# Patient Record
Sex: Female | Born: 1995 | Hispanic: Yes | Marital: Single | Smoking: Never smoker
Health system: Southern US, Community
[De-identification: ages and names within clinical notes are randomized; demographics above are authoritative.]

## PROBLEM LIST (undated history)

## (undated) DIAGNOSIS — K2970 Gastritis, unspecified, without bleeding: Secondary | ICD-10-CM

## (undated) HISTORY — PX: NO PAST SURGERIES: SHX2092

## (undated) HISTORY — DX: Gastritis, unspecified, without bleeding: K29.70

---

## 2014-11-10 NOTE — L&D Delivery Note (Signed)
Patient is 19 y.o. G1P0000 2267w2d admitted in active labor and progressed without augmentation, [facility]   Delivery Note At 12:03 PM a viable female was delivered via Vaginal, Spontaneous Delivery (Presentation:Vertex, Occiput Anterior).  APGAR: 9, 9; weight -pending .   Placenta status: Intact, Spontaneous.  Cord: 3 vessels with the following complications: Second degree perineal laceration repaired in the typical fashion.   Cord pH: not collected  Anesthesia: Epidural  Episiotomy: None Lacerations: 2nd degree Suture Repair: 3.0 Est. Blood Loss (mL): 450  Mom to postpartum.  Baby to Couplet care / Skin to Skin.  Isa RankinKimberly Niles [facility] 09/24/2015, 12:33 PM

## 2015-08-14 LAB — OB RESULTS CONSOLE GC/CHLAMYDIA
CHLAMYDIA, DNA PROBE: NEGATIVE
GC PROBE AMP, GENITAL: NEGATIVE

## 2015-08-14 LAB — OB RESULTS CONSOLE ANTIBODY SCREEN: Antibody Screen: NEGATIVE

## 2015-08-14 LAB — OB RESULTS CONSOLE RUBELLA ANTIBODY, IGM: RUBELLA: IMMUNE

## 2015-08-14 LAB — OB RESULTS CONSOLE HIV ANTIBODY (ROUTINE TESTING): HIV: NONREACTIVE

## 2015-08-14 LAB — OB RESULTS CONSOLE PLATELET COUNT: PLATELETS: 189 10*3/uL

## 2015-08-14 LAB — OB RESULTS CONSOLE HGB/HCT, BLOOD
HEMATOCRIT: 37 %
Hemoglobin: 12 g/dL

## 2015-08-14 LAB — OB RESULTS CONSOLE HEPATITIS B SURFACE ANTIGEN: Hepatitis B Surface Ag: NEGATIVE

## 2015-08-14 LAB — OB RESULTS CONSOLE RPR: RPR: NONREACTIVE

## 2015-08-14 LAB — OB RESULTS CONSOLE ABO/RH: RH TYPE: POSITIVE

## 2015-08-22 LAB — OB RESULTS CONSOLE GBS: GBS: NEGATIVE

## 2015-08-29 ENCOUNTER — Encounter: Payer: Self-pay | Admitting: Advanced Practice Midwife

## 2015-09-03 ENCOUNTER — Encounter: Payer: Self-pay | Admitting: *Deleted

## 2015-09-03 DIAGNOSIS — O0933 Supervision of pregnancy with insufficient antenatal care, third trimester: Secondary | ICD-10-CM | POA: Insufficient documentation

## 2015-09-04 ENCOUNTER — Encounter: Payer: Self-pay | Admitting: Family

## 2015-09-04 ENCOUNTER — Ambulatory Visit (INDEPENDENT_AMBULATORY_CARE_PROVIDER_SITE_OTHER): Payer: Medicaid Other | Admitting: Family

## 2015-09-04 VITALS — BP 92/56 | HR 76 | Ht <= 58 in | Wt 104.7 lb

## 2015-09-04 DIAGNOSIS — O0933 Supervision of pregnancy with insufficient antenatal care, third trimester: Secondary | ICD-10-CM | POA: Diagnosis not present

## 2015-09-04 DIAGNOSIS — Z23 Encounter for immunization: Secondary | ICD-10-CM

## 2015-09-04 LAB — POCT URINALYSIS DIP (DEVICE)
BILIRUBIN URINE: NEGATIVE
Glucose, UA: NEGATIVE mg/dL
HGB URINE DIPSTICK: NEGATIVE
Nitrite: NEGATIVE
PH: 6.5 (ref 5.0–8.0)
Protein, ur: NEGATIVE mg/dL
SPECIFIC GRAVITY, URINE: 1.025 (ref 1.005–1.030)
Urobilinogen, UA: 0.2 mg/dL (ref 0.0–1.0)

## 2015-09-04 MED ORDER — TETANUS-DIPHTH-ACELL PERTUSSIS 5-2.5-18.5 LF-MCG/0.5 IM SUSP
0.5000 mL | Freq: Once | INTRAMUSCULAR | Status: AC
  Administered 2015-09-04: 0.5 mL via INTRAMUSCULAR

## 2015-09-04 NOTE — Patient Instructions (Signed)
AREA PEDIATRIC/FAMILY PRACTICE PHYSICIANS  [facility] [address] [place], [place] [address] Phone - [phone]   Fax - [phone]  [facility] AM[facility] [address] [place], [place]  [address] Phone - [phone]   Fax - [phone]  [facility] [address] [place], [place]  [address] Phone - [phone]   Fax - [phone]  [facility] [address] [place], [place]  [address] Phone - [phone]   Fax - [phone]  [facility] [address] [place], [place]  [address] Phone - [phone]   Fax - [phone]  [facility] [address] [place], [place]  [address] Phone - [phone]   Fax - [phone]  [facility] [address] [place], [place]  [address] Phone - [phone]   Fax - [phone]  [facility] [address], [address] [place], [place]  [address] Phone - [phone]   Fax - [phone]  [facility] [address] [place], [place]  [address] Phone - [phone]   Fax - [phone] [facility] [address] [place], [place]  [address] Phone - [phone]   Fax - [phone]  [facility] [address] [place], [place]  [address] Phone - [phone]   Fax - [phone]  [facility] [address] [place], [place]  [address] Phone - [phone]   Fax - [phone]  [facility] [address], [address] [place], [place]  [address] Phone - [phone]   Fax - [phone]  SHILPA GOSRANI [address] [place], [place]  [address] Phone - [phone]  [facility] [address] [place], [place]  [address] Phone - [phone]   Fax - [phone]  [facility] [address] [place], [place]  [address] Phone - [phone]   Fax - [phone]  [facility] [address] [place], [place]  [address] Phone - [phone]   Fax - [phone]  [facility] [address] [place], [place]  [address] Phone - [phone]   Fax - [phone]   [facility] [address] [place], [place]  [address] Phone - [phone]   Fax - [phone]  [facility] [address] [place], [place]  [address] Phone - [phone]   Fax - [phone]  DAVID RUBIN [address] [place], [place]  [address] Phone - [phone]   Fax - [phone]  [facility] [address] [place], [place]  [address] Phone - [phone]   Fax - [phone]  [facility] [address] [place], [place]  [address] Phone - [phone]   Fax - [phone] [facility] [address] [place], [place]  [address] Phone - [phone]   Fax - [phone]  [facility] [address] [place], [place]  [address] Phone - [phone]   Fax - [phone]  [facility] [address] ,   [address] Phone - [phone]   Fax - [phone]   

## 2015-09-04 NOTE — Progress Notes (Signed)
Raquel used for interpreter; new OB + 28wk packets given

## 2015-09-04 NOTE — Progress Notes (Signed)
   Subjective:    Brianna Bishop is a G1P0000 6272w3d being seen today for her first obstetrical visit.  Her obstetrical history is significant for late prenatal care. [place].  Recently moved here with sister.  Received two late pregnancy OB visit.  Patient does intend to breast feed. Pregnancy history fully reviewed.  Patient reports no complaints.  Filed Vitals:   09/04/15 0842 09/04/15 0844  BP: 92/56   Pulse: 76   Height:  4\' 10"  (1.473 m)  Weight: 104 lb 11.2 oz (47.492 kg)     HISTORY: OB History  Gravida Para Term Preterm AB SAB TAB Ectopic Multiple Living  1 0 0 0 0 0 0 0 0 0     # Outcome Date GA Lbr Len/2nd Weight Sex Delivery Anes PTL Lv  1 Current              Past Medical History  Diagnosis Date  . Gastritis    Past Surgical History  Procedure Laterality Date  . No past surgeries     Family History  Problem Relation Age of Onset  . Cancer Maternal Grandmother   . Diabetes Paternal Grandmother      Exam   Filed Vitals:   09/04/15 0842 09/04/15 0844  BP: 92/56   Pulse: 76   Height:  4\' 10"  (1.473 m)  Weight: 104 lb 11.2 oz (47.492 kg)     Fetal Status: Fetal Heart Rate (bpm): 131 Fundal Height: 35 cm Movement: Present     General:  Alert, oriented and cooperative. Patient is in no acute distress.  Skin: Skin is warm and dry. No rash noted.   Cardiovascular: Normal heart rate noted  Respiratory: Normal respiratory effort, no problems with respiration noted  Abdomen: Soft, gravid, appropriate for gestational age. Pain/Pressure: Present     Pelvic: Vag. Bleeding: None     Cervical exam deferred        Extremities: Normal range of motion.  Edema: None  Mental Status: Normal mood and affect. Normal behavior. Normal judgment and thought content.   Urinalysis: Urine Protein: Negative Urine Glucose: Negative     Assessment:    Pregnancy: G1P0000 Patient Active Problem List   Diagnosis Date Noted  . Late prenatal care  affecting pregnancy in third trimester 09/03/2015        Plan:     Initial labs drawn. Prenatal vitamins. Problem list reviewed and updated. Ultrasound discussed; fetal survey: ordered. Follow up in 1 weeks.  Marlis EdelsonKARIM, Matie Dimaano N 09/04/2015

## 2015-09-05 LAB — GLUCOSE TOLERANCE, 1 HOUR (50G) W/O FASTING: Glucose, 1 Hour GTT: 98 mg/dL (ref 70–140)

## 2015-09-07 ENCOUNTER — Ambulatory Visit (HOSPITAL_COMMUNITY): Payer: Self-pay

## 2015-09-10 ENCOUNTER — Other Ambulatory Visit: Payer: Self-pay | Admitting: General Practice

## 2015-09-10 ENCOUNTER — Ambulatory Visit (HOSPITAL_COMMUNITY)
Admission: RE | Admit: 2015-09-10 | Discharge: 2015-09-10 | Disposition: A | Discharge status: Home / Self Care | Payer: Medicaid Other | Source: Ambulatory Visit | Attending: Family | Admitting: Family

## 2015-09-10 DIAGNOSIS — Z36 Encounter for antenatal screening of mother: Secondary | ICD-10-CM | POA: Insufficient documentation

## 2015-09-10 DIAGNOSIS — O0933 Supervision of pregnancy with insufficient antenatal care, third trimester: Secondary | ICD-10-CM

## 2015-09-10 DIAGNOSIS — Z3A38 38 weeks gestation of pregnancy: Secondary | ICD-10-CM | POA: Diagnosis not present

## 2015-09-11 ENCOUNTER — Ambulatory Visit (INDEPENDENT_AMBULATORY_CARE_PROVIDER_SITE_OTHER): Payer: Medicaid Other | Admitting: Obstetrics and Gynecology

## 2015-09-11 VITALS — BP 93/46 | HR 68 | Temp 97.7°F | Wt 107.9 lb

## 2015-09-11 DIAGNOSIS — O0933 Supervision of pregnancy with insufficient antenatal care, third trimester: Secondary | ICD-10-CM | POA: Diagnosis not present

## 2015-09-11 DIAGNOSIS — Z113 Encounter for screening for infections with a predominantly sexual mode of transmission: Secondary | ICD-10-CM | POA: Diagnosis not present

## 2015-09-11 LAB — POCT URINALYSIS DIP (DEVICE)
BILIRUBIN URINE: NEGATIVE
Glucose, UA: NEGATIVE mg/dL
Ketones, ur: NEGATIVE mg/dL
NITRITE: NEGATIVE
PH: 7 (ref 5.0–8.0)
PROTEIN: NEGATIVE mg/dL
Specific Gravity, Urine: 1.02 (ref 1.005–1.030)
UROBILINOGEN UA: 2 mg/dL — AB (ref 0.0–1.0)

## 2015-09-11 NOTE — Progress Notes (Signed)
Alviris used for interpreter [place] le[place] on UA  Reviewed tip of week with patient

## 2015-09-11 NOTE — Progress Notes (Signed)
Subjective:  Brianna Bishop is a 19 y.o. G1P0000 at 9682w3d being seen today for ongoing prenatal care.  Patient reports no complaints.  Contractions: Not present.  Vag. Bleeding: None. Movement: Present. Denies leaking of fluid.   The following portions of the patient's history were reviewed and updated as appropriate: allergies, current medications, past family history, past medical history, past social history, past surgical history and problem list. Problem list updated.  Objective:   Filed Vitals:   09/11/15 1019  BP: 93/46  Pulse: 68  Temp: 97.7 F (36.5 C)  Weight: 107 lb 14.4 oz (48.943 kg)    Fetal Status: Fetal Heart Rate (bpm): 127   Movement: Present     General:  Alert, oriented and cooperative. Patient is in no acute distress.  Skin: Skin is warm and dry. No rash noted.   Cardiovascular: Normal heart rate noted  Respiratory: Normal respiratory effort, no problems with respiration noted  Abdomen: Soft, gravid, appropriate for gestational age. Pain/Pressure: Absent     Pelvic: Vag. Bleeding: None     Cervical exam deferred        Extremities: Normal range of motion.  Edema: None  Mental Status: Normal mood and affect. Normal behavior. Normal judgment and thought content.   Urinalysis: Urine Protein: Negative Urine Glucose: Negative  Assessment and Plan:  Pregnancy: G1P0000 at 8482w3d  1. Late prenatal care affecting pregnancy in third trimester - gc/chlamydia today, neg gbs 10/12, will need to be redrawn if doesn't delivery by 11/16 - requesting ultrasound results as pt says [place] normal anatomy [place] in [place], and currently we are dating pregnancy by a 38-week scan  Term labor symptoms and general obstetric precautions including but not limited to vaginal bleeding, contractions, leaking of fluid and fetal movement were reviewed in detail with the patient. Please refer to After Visit Summary for other counseling recommendations.  Return in about 1 week (around  09/18/2015).   Kathrynn RunningNoah Bedford Ikran Patman, MD

## 2015-09-12 LAB — GC/CHLAMYDIA PROBE AMP (~~LOC~~) NOT AT ARMC
CHLAMYDIA, DNA PROBE: NEGATIVE
NEISSERIA GONORRHEA: NEGATIVE

## 2015-09-18 ENCOUNTER — Ambulatory Visit (INDEPENDENT_AMBULATORY_CARE_PROVIDER_SITE_OTHER): Payer: Medicaid Other | Admitting: Advanced Practice Midwife

## 2015-09-18 ENCOUNTER — Other Ambulatory Visit: Payer: Medicaid Other

## 2015-09-18 VITALS — BP 96/56 | HR 68 | Temp 98.2°F | Wt 107.8 lb

## 2015-09-18 DIAGNOSIS — O0933 Supervision of pregnancy with insufficient antenatal care, third trimester: Secondary | ICD-10-CM | POA: Diagnosis not present

## 2015-09-18 LAB — POCT URINALYSIS DIP (DEVICE)
Bilirubin Urine: NEGATIVE
Glucose, UA: NEGATIVE mg/dL
HGB URINE DIPSTICK: NEGATIVE
Ketones, ur: NEGATIVE mg/dL
NITRITE: NEGATIVE
PROTEIN: NEGATIVE mg/dL
Specific Gravity, Urine: 1.02 (ref 1.005–1.030)
UROBILINOGEN UA: 1 mg/dL (ref 0.0–1.0)
pH: 7 (ref 5.0–8.0)

## 2015-09-18 NOTE — Progress Notes (Signed)
Subjective:  Brianna Bishop is a 19 y.o. G1P0000 at 4871w3d being seen today for ongoing prenatal care.  Patient reports groin pain.  Contractions: Irregular.  Vag. Bleeding: None. Movement: Present. Denies leaking of fluid.   The following portions of the patient's history were reviewed and updated as appropriate: allergies, current medications, past family history, past medical history, past social history, past surgical history and problem list. Problem list updated.  Objective:   Filed Vitals:   09/18/15 1054  BP: 96/56  Pulse: 68  Temp: 98.2 F (36.8 C)  Weight: 107 lb 12.8 oz (48.898 kg)    Fetal Status: Fetal Heart Rate (bpm): 130   Movement: Present     General:  Alert, oriented and cooperative. Patient is in no acute distress.  Skin: Skin is warm and dry. No rash noted.   Cardiovascular: Normal heart rate noted  Respiratory: Normal respiratory effort, no problems with respiration noted  Abdomen: Soft, gravid, appropriate for gestational age. Pain/Pressure: Present     Pelvic: Vag. Bleeding: None     Cervical exam deferred        Extremities: Normal range of motion.     Mental Status: Normal mood and affect. Normal behavior. Normal judgment and thought content.   Urinalysis:      Assessment and Plan:  Pregnancy: G1P0000 at 771w3d  1. Late prenatal care affecting pregnancy in third trimester   Term labor symptoms and general obstetric precautions including but not limited to vaginal bleeding, contractions, leaking of fluid and fetal movement were reviewed in detail with the patient. Please refer to After Visit Summary for other counseling recommendations.  GBS is not delivered by next appt.  Return in about 1 week (around 09/25/2015) for ROB w/ NST/AFI.   Dorathy KinsmanVirginia Nashae Maudlin, CNM

## 2015-09-18 NOTE — Patient Instructions (Signed)
Parto vaginal (Vaginal Delivery) Durante el parto, el mdico la ayudar a dar a luz a su beb. En elparto vaginal, deber pujar para que el beb salga por la vagina. Sin embargo, antes de que pueda sacar al beb, es necesario que ocurran ciertas cosas. La abertura del tero (cuello del tero) tiene que ablandarse, hacerse ms [place] y abrirse (dilatar) hasta que llegue a 10 cm. Adems, el beb tiene que bajar desde el tero a la vagina. SIGNOS DE TRABAJO DE PARTO  El mdico tendr primero que asegurarse de que usted est en trabajo de parto. Algunos signos son:   Eliminar lo que se llama tapn mucoso antes del inicio del trabajo de parto. Este es una pequea cantidad de mucosidad teida con sangre.  Tener contracciones uterinas regulares y dolorosas.   El tiempo entre las contracciones debe acortarse  Las molestias y el dolor se harn ms intensos gradualmente.  El dolor de las contracciones empeora al caminar y no se alivia con el reposo.   El cuello del tero se hace mas [place] (se borra) y se dilata. ANTES DEL PARTO Una vez que se inicie el trabajo de parto y sea admitida en el hospital o sanatorio, el mdico podr hacer lo siguiente:   Realizar un examen fsico.  Controlar si hay complicaciones relacionadas con el trabajo de parto.  Verificar su presin arterial, temperatura y pulso y la frecuencia cardaca (signos vitales).   Determinar si se ha roto el saco amnitico y cundo ha ocurrido.  Realizar un examen vaginal (utilizando un guante estril y un lubricante) para determinar:  La posicin (presentacin) del beb. El beb se presenta con la cabeza primero (vertex) en el canal de parto (vagina), o estn los pies o las nalgas primero (de nalgas)?  El nivel (estacin) de la cabeza del beb dentro del canal de parto.  El borramiento y la dilatacin del cuello uterino  El monitor fetal electrnico generalmente se coloca sobre el abdomen al llegar. Se utiliza para  controlar las contracciones y la frecuencia cardaca del beb.  Cuando el monitor est en el abdomen (monitor fetal externo), slo toma la frecuencia y la duracin de las contracciones. No informa acerca de la intensidad de las contracciones.  Si el mdico necesita saber exactamente la intensidad de las contracciones o cul es la frecuencia cardaca del beb, colocar un monitor interno en la vagina y el tero. El mdico comentar los riesgos y los beneficios de usar un monitor interno y le pedir autorizacin antes de colocar el dispositivo.  El monitoreo fetal continuo ser necesario si le han aplicado una epidural, si le administran ciertos medicamentos (como oxitocina) y si tiene complicaciones del embarazo o del trabajo de parto.  Podrn colocarle una va intravenosa en una vena del brazo para suministrarle lquidos y medicamentos, si es necesario. TRES ETAPAS DEL TRABAJO DE PARTO Y EL PARTO El trabajo de parto y el parto normales se dividen en tres etapas. Primera etapa Esta etapa comienza cuando comienzan las contracciones regulares y el cuello comienza a borrarse y dilatarse. Finaliza cuando el cuello est completamente abierto (completamente dilatado). La primera etapa es la etapa ms larga del trabajo de parto y puede durar desde 3 horas a 15 horas.  Algunos mtodos estn disponibles para ayudar con el dolor del parto. Usted y su mdico decidirn qu opcin es la mejor para usted. Las opciones incluyen:   Medicamentos narcticos. Estos son medicamentos fuertes que usted puede recibir a travs de una va intravenosa o   como inyeccin en el msculo. Estos medicamentos alivian el dolor pero no hacen que desaparezca completamente.  Epidural. Se administra un medicamento a travs de un tubo [place] que se inserta en la espalda. El medicamento adormece la parte inferior del cuerpo y evita el dolor en esa zona.  Bloqueo paracervical Es una inyeccin de un anestsico en cada lado del cuello  uterino.  Usted podr pedir un parto natural, que implica que no se usen analgsicos ni epidural durante el parto y el trabajo de parto. En cambio, podr tener otro tipo de ayuda como ejercicios respiratorios para hacer frente al dolor. Segunda etapa La segunda etapa del trabajo de parto comienza cuando el cuello se ha dilatado completamente a 10 cm. Contina hasta que usted puja al beb hacia abajo, por el canal de parto, y el beb nace. Esta etapa puede durar slo algunos minutos o algunas horas.  La posicin del la cabeza del beb a medida que pasa por el canal de parto, es informada como un nmero, llamado estacin. Si la cabeza del beb no ha iniciado su descenso, la estacin se describe como que est en menos 3 (-3). Cuando la cabeza del beb est en la estacin cero, est en el medio del canal de parto y se encaja en la pelvis. La estacin en la que se encuentra el beb indica el progreso de la segunda etapa del trabajo de parto.  Cuando el beb nace, el mdico lo sostendr con la cabeza hacia abajo para evitar que el lquido amnitico, el moco y la sangre entren en los pulmones del beb. La boca y la nariz del beb podrn ser succionadas con un pequeo bulbo para retirar todo lquido adicional.  El mdico podr colocar al beb sobre su estmago. Es importante evitar que el beb tome fro. Para hacerlo, el mdico secar al beb, lo colocar directamente sobre su piel, (sin mantas entre usted y el beb) y lo cubrir con mantas secas y tibias.  Se corta el cordn umbilical. Tercera etapa Durante la tercera etapa del trabajo de parto, el mdico sacar la placenta (alumbramiento) y se asegurar de que el sangrado est controlado. La salida de la placenta generalmente demora 5 minutos pero puede tardar hasta 30 minutos. Luego de la salida de la placenta, le darn un medicamento por va intravenosa o inyectable para ayudar a contraer el tero y controlar el sangrado. Si planea amamantar al beb,  puede intentar en este momento Luego de la salida de la placenta, el tero debe contraerse y quedar muy firme. Si el tero no queda firme, el mdico lo masajear. Esto es importante debido a que la contraccin del tero ayuda a cortar el sangrado en el sitio en que la placenta estaba unida al tero. Si el tero no se contrae adecuadamente ni permanece firme, podr causar un sangrado abundante. Si hay mucho sangrado, podrn darle medicamentos para contraer el tero y detener el sangrado.    Esta informacin no tiene como fin reemplazar el consejo del mdico. Asegrese de hacerle al mdico cualquier pregunta que tenga.   Document Released: 10/09/2008 Document Revised: 11/17/2014 Elsevier Interactive Patient Education 2016 [employer]  

## 2015-09-23 ENCOUNTER — Inpatient Hospital Stay (HOSPITAL_COMMUNITY)
Admission: AD | Admit: 2015-09-23 | Discharge: 2015-09-23 | Disposition: A | Discharge status: Home / Self Care | Payer: Medicaid Other | Source: Ambulatory Visit | Attending: Obstetrics & Gynecology | Admitting: Obstetrics & Gynecology

## 2015-09-23 ENCOUNTER — Inpatient Hospital Stay (HOSPITAL_COMMUNITY): Payer: Medicaid Other | Admitting: Anesthesiology

## 2015-09-23 ENCOUNTER — Inpatient Hospital Stay (HOSPITAL_COMMUNITY)
Admission: AD | Admit: 2015-09-23 | Discharge: 2015-09-26 | DRG: 775 | Disposition: A | Discharge status: Home / Self Care | Payer: Medicaid Other | Source: Ambulatory Visit | Attending: Obstetrics & Gynecology | Admitting: Obstetrics & Gynecology

## 2015-09-23 ENCOUNTER — Encounter (HOSPITAL_COMMUNITY): Payer: Self-pay

## 2015-09-23 DIAGNOSIS — O0933 Supervision of pregnancy with insufficient antenatal care, third trimester: Secondary | ICD-10-CM

## 2015-09-23 DIAGNOSIS — Z809 Family history of malignant neoplasm, unspecified: Secondary | ICD-10-CM | POA: Diagnosis not present

## 2015-09-23 DIAGNOSIS — Z833 Family history of diabetes mellitus: Secondary | ICD-10-CM | POA: Diagnosis not present

## 2015-09-23 DIAGNOSIS — O480 Post-term pregnancy: Principal | ICD-10-CM | POA: Diagnosis not present

## 2015-09-23 DIAGNOSIS — Z3A40 40 weeks gestation of pregnancy: Secondary | ICD-10-CM | POA: Diagnosis not present

## 2015-09-23 DIAGNOSIS — IMO0001 Reserved for inherently not codable concepts without codable children: Secondary | ICD-10-CM

## 2015-09-23 LAB — CBC
HEMATOCRIT: 41.8 % (ref 36.0–46.0)
Hemoglobin: 14.5 g/dL (ref 12.0–15.0)
MCH: 29.7 pg (ref 26.0–34.0)
MCHC: 34.7 g/dL (ref 30.0–36.0)
MCV: 85.5 fL (ref 78.0–100.0)
Platelets: 171 10*3/uL (ref 150–400)
RBC: 4.89 MIL/uL (ref 3.87–5.11)
RDW: 14.9 % (ref 11.5–15.5)
WBC: 11.3 10*3/uL — AB (ref 4.0–10.5)

## 2015-09-23 LAB — TYPE AND SCREEN
ABO/RH(D): A POS
ANTIBODY SCREEN: NEGATIVE

## 2015-09-23 LAB — AMNISURE RUPTURE OF MEMBRANE (ROM) NOT AT ARMC: AMNISURE: NEGATIVE

## 2015-09-23 MED ORDER — PHENYLEPHRINE 40 MCG/ML (10ML) SYRINGE FOR IV PUSH (FOR BLOOD PRESSURE SUPPORT)
80.0000 ug | PREFILLED_SYRINGE | INTRAVENOUS | Status: DC | PRN
  Filled 2015-09-23: qty 2

## 2015-09-23 MED ORDER — ONDANSETRON HCL 4 MG/2ML IJ SOLN: 4.0000 mg | Freq: Four times a day (QID) | INTRAMUSCULAR | Status: DC | PRN

## 2015-09-23 MED ORDER — LACTATED RINGERS IV SOLN: 500.0000 mL | INTRAVENOUS | Status: DC | PRN

## 2015-09-23 MED ORDER — FENTANYL 2.5 MCG/ML BUPIVACAINE 1/10 % EPIDURAL INFUSION (WH - ANES)
INTRAMUSCULAR | Status: AC
  Administered 2015-09-23: 14 mL/h via EPIDURAL
  Filled 2015-09-23: qty 125

## 2015-09-23 MED ORDER — ACETAMINOPHEN 325 MG PO TABS: 650.0000 mg | ORAL_TABLET | ORAL | Status: DC | PRN

## 2015-09-23 MED ORDER — FLEET ENEMA 7-19 GM/118ML RE ENEM: 1.0000 | ENEMA | RECTAL | Status: DC | PRN

## 2015-09-23 MED ORDER — EPHEDRINE 5 MG/ML INJ
10.0000 mg | INTRAVENOUS | Status: DC | PRN
  Filled 2015-09-23: qty 2

## 2015-09-23 MED ORDER — LIDOCAINE HCL (PF) 1 % IJ SOLN
INTRAMUSCULAR | Status: DC | PRN
  Administered 2015-09-23 (×2): 6 mL via EPIDURAL

## 2015-09-23 MED ORDER — LIDOCAINE HCL (PF) 1 % IJ SOLN
30.0000 mL | INTRAMUSCULAR | Status: AC | PRN
  Administered 2015-09-24: 30 mL via SUBCUTANEOUS
  Filled 2015-09-23: qty 30

## 2015-09-23 MED ORDER — OXYCODONE-ACETAMINOPHEN 5-325 MG PO TABS: 2.0000 | ORAL_TABLET | ORAL | Status: DC | PRN

## 2015-09-23 MED ORDER — OXYTOCIN BOLUS FROM INFUSION: 500.0000 mL | INTRAVENOUS | Status: DC

## 2015-09-23 MED ORDER — DIPHENHYDRAMINE HCL 50 MG/ML IJ SOLN: 12.5000 mg | INTRAMUSCULAR | Status: DC | PRN

## 2015-09-23 MED ORDER — OXYTOCIN 40 UNITS IN LACTATED RINGERS INFUSION - SIMPLE MED
62.5000 mL/h | INTRAVENOUS | Status: DC
  Administered 2015-09-24: 62.5 mL/h via INTRAVENOUS
  Filled 2015-09-23: qty 1000

## 2015-09-23 MED ORDER — CITRIC ACID-SODIUM CITRATE 334-500 MG/5ML PO SOLN: 30.0000 mL | ORAL | Status: DC | PRN

## 2015-09-23 MED ORDER — FENTANYL 2.5 MCG/ML BUPIVACAINE 1/10 % EPIDURAL INFUSION (WH - ANES)
14.0000 mL/h | INTRAMUSCULAR | Status: DC | PRN
  Administered 2015-09-23 – 2015-09-24 (×4): 14 mL/h via EPIDURAL
  Filled 2015-09-23 (×2): qty 125

## 2015-09-23 MED ORDER — OXYCODONE-ACETAMINOPHEN 5-325 MG PO TABS: 1.0000 | ORAL_TABLET | ORAL | Status: DC | PRN

## 2015-09-23 MED ORDER — LACTATED RINGERS IV SOLN
INTRAVENOUS | Status: DC
  Administered 2015-09-23 – 2015-09-24 (×3): via INTRAVENOUS

## 2015-09-23 NOTE — Discharge Instructions (Signed)
Alivio del dolor durante el trabajo de parto y Engineer, manufacturing systems (Pain Relief During Labor and Delivery) Todas las personas experimentan el dolor de [place], pero el trabajo de parto causa dolor intenso en muchas mujeres. El nivel de dolor que usted experimenta en el trabajo del parto depende de su tolerancia al dolor, de la intensidad de las contracciones y del tamao y posicin del beb. Hay muchas formas de prepararse y Software engineer, por ejemplo:   Tomar clases de preparto para aprender acerca del Aleen Bishop de parto y Brianna Bishop. Cuanto ms informada est, menos ansiedad y temor tendr. Vivia Bishop puede ayudar a Brianna Bishop.  Tomar medicamentos para Engineer, materials durante el [name] de parto y [name].  Aprender y Chemical engineer tcnicas de relajacin.  Tomar una ducha o un bao de inmersin.  Recibir masajes.  Cambiar de posicin.  Colocarse una bolsa de hielo en la espalda. Comente las opciones para el control del dolor con su mdico durante las visitas prenatales.  CULES SON LAS DOS OPCIONES DE MEDICAMENTOS PARA ALIVIAR EL DOLOR? 1. Analgsicos. Estos son medicamentos que Psychologist, occupational sin que se pierdan totalmente la sensibilidad o los movimientos musculares. 2. Anestsicos. Estos son medicamentos que bloquean toda la sensibilidad, incluso Chief Technology Officer. Ambos tipos tienen efectos secundarios mnimos, como nuseas, dificultad para concentrarse, estar somnolienta y disminuir la frecuencia cardaca del beb. Sin embargo, el mdico tendr la precaucin de Building services engineer dosis que no afecten demasiado al beb.  CULES SON LOS TIPOS ESPECFICOS DE ANALGSICOS Y ANESTSICOS? Analgsico sistmico Los medicamentos para Chief Technology Officer sistmicos no se Psychologist, educational en la zona en la que lo siente sino que afectan todo su organismo. Este tipo de medicamento se administra a travs de una va IV en la vena o de modo inyectable (inyeccin) en el msculo. Este medicamento Pharmacologist pero no lo calmar totalmente. Tambin la har sentir somnolienta, pero no har que pierda la conciencia.  Anestsico local El anestsico local seutiliza paraadormecer una pequea zona del cuerpo. El medicamento se inyecta en la zona de los nervios que transmiten la sensibilidad a la vagina, la vulva o la zona que se encuentra entre la vagina y el ano (perineo). Anestesia general Este tipo de medicamento provoca la prdida de la conciencia de modo que no Financial risk analyst. Generalmente se [place] solo en caso de situaciones de emergencia durante el trabajo de [place]. Se administra a travs de una va IV o una mscara facial. Bloqueo paracervical Un bloqueo paracervical es una forma de anestesia local que se administra durante el [place]. Se inyecta el anestsico en las zonas derecha e izquierda del cuello del tero y la vagina. Esto ayuda a Brianna Bishop causado por las contracciones y el estiramiento del cuello del tero. Puede administrase ms de una vez.  Bloqueo pudendo El bloqueo pudendo es otra forma de anestesia local. Se utiliza para Acupuncturist dolor asociado con la presin o la distensin del perineo en el momento del [place]. Se aplica una inyeccin de manera profunda en la pared vaginal hacia el nervio pudendo en la pelvis, adormeciendo el perineo  Anestesia epidural La epidural es una inyeccin de medicamento anestsico que se aplica en la parte inferior de la espalda y en el espacio epidural, cerca de la mdula espinal. La epidural adormece la mitad inferior del cuerpo. Podr mover las piernas, pero no Warehouse manager. La epidural se [place] en el trabajo de [place], el [place]  o la cesrea.  Para evitar que se termine el efecto del medicamento, le colocarn un tubo pequeo (catter) en el espacio epidural y lo pegarn con cinta en el lugar para evitar que se salga. Luego le administran el medicamento de [place] continua, en pequeas dosis, a travs del tubo, hasta que finalice el  [place]. Bloqueo espinal El bloqueo espinal es similar a la epidural, pero los medicamentos se inyectan en el lquido espinal y no en el espacio epidural. El bloqueo espinal solo se aplica una vez. Comienza a Dispensing optician, pero solo dura 1 o 2horas. El bloqueo espinal tambin se [place] en las cesreas.  Bloqueo espinal-epidural combinado El bloqueo espinal-epidural combinado rene los beneficios de ambos mtodos. La parte espinal acta rpidamente para Engineer, materials y la epidural proporciona alivio continuo del Engineer, mining. Hidroterapia La inmersin en agua caliente durante el trabajo de parto puede proporcionar comodidad y relax. Tambin ayuda a [employer], el uso de anestesia y la duracin del [place]. Sin embargo, la inmersin en agua durante el parto (parto en el agua) puede Advertising copywriter, y se estn realizando estudios para Chief Strategy Officer su seguridad y sus riesgos. Si es [employer] sana que espera un parto sin complicaciones, hable con su mdico para ver si el parto en el agua es una opcin para usted.    Esta informacin no tiene Theme park manager el consejo del mdico. Asegrese de hacerle al mdico cualquier pregunta que tenga.   Document Released: 08/24/2009 Document Revised: 11/01/2013 Elsevier Interactive Patient Education 2016 [employer]. Evaluacin de los movimientos fetales  (Fetal Movement Counts) Nombre del paciente: __________________________________________________ Brianna Bishop estimada: ____________________ Brianna Bishop de los movimientos fetales es muy recomendable en los embarazos de alto riesgo, pero tambin es una buena idea que lo hagan todas las [place]. El Firefighter que comience a contarlos a las 28 semanas de [place]. Los movimientos fetales suelen aumentar:   Despus de Animator.  Despus de la actividad fsica.  Despus de comer o beber [employer] o fro.  En reposo. Preste atencin cuando sienta que  el beb est ms activo. Esto le ayudar a notar un patrn de ciclos de vigilia y sueo de su beb y cules son los factores que contribuyen a un aumento de los movimientos fetales. Es importante llevar a cabo un recuento de movimientos fetales, al mismo tiempo cada da, cuando el beb normalmente est ms activo.  CMO CONTAR LOS MOVIMIENTOS FETALES 3. Busque un lugar tranquilo y cmodo para sentarse o recostarse sobre el lado izquierdo. Al recostarse sobre su lado izquierdo, le proporciona una mejor circulacin de [place] y oxgeno al beb. 4. Anote el da y la hora en una hoja de papel o en un diario. 5. Comience contando las pataditas, revoloteos, chasquidos, vueltas o pinchazos en un perodo de 2 horas. Debe sentir al menos 10 movimientos en 2 horas. 6. Si no siente 10 movimientos en 2 horas, espere 2  3 horas y cuente de nuevo. Busque cambios en el patrn o si no cuenta lo suficiente en 2 horas. SOLICITE ATENCIN MDICA SI:   Siente menos de 10 pataditas en 2 horas, en dos intentos.  No hay movimientos durante una hora.  El patrn se modifica o le lleva ms tiempo Art gallery manager las 10 pataditas.  Siente que el beb no se mueve como lo hace habitualmente. Fecha: ____________ Movimientos: ____________ Stevan Born inicio: ____________ Stevan Born finalizacin: ____________  Franco Nones:  ____________ Movimientos: ____________ Stevan BornHora de inicio: ____________ Stevan BornHora de finalizacin: ____________  Franco NonesFecha: ____________ Movimientos: ____________ Stevan BornHora de inicio: ____________ Stevan BornHora de finalizacin: ____________  Franco NonesFecha: ____________ Movimientos: ____________ Stevan BornHora de inicio: ____________ Stevan BornHora de finalizacin: ____________  Franco NonesFecha: ____________ Movimientos: ____________ Stevan BornHora de inicio: ____________ Stevan BornHora de finalizacin: ____________  Franco NonesFecha: ____________ Movimientos: ____________ Stevan BornHora de inicio: ____________ Stevan BornHora de finalizacin: ____________  Franco NonesFecha: ____________ Movimientos: ____________ Mammie RussianHora de inicio: ____________ Mammie RussianHora  de finalizacin: ____________  Franco NonesFecha: ____________ Movimientos: ____________ Mammie RussianHora de inicio: ____________ Mammie RussianHora de finalizacin: ____________  Franco NonesFecha: ____________ Movimientos: ____________ Mammie RussianHora de inicio: ____________ Mammie RussianHora de finalizacin: ____________  Franco NonesFecha: ____________ Movimientos: ____________ Mammie RussianHora de inicio: ____________ Mammie RussianHora de finalizacin: ____________  Franco NonesFecha: ____________ Movimientos: ____________ Mammie RussianHora de inicio: ____________ Mammie RussianHora de finalizacin: ____________  Franco NonesFecha: ____________ Movimientos: ____________ Mammie RussianHora de inicio: ____________ Mammie RussianHora de finalizacin: ____________  Franco NonesFecha: ____________ Movimientos: ____________ Mammie RussianHora de inicio: ____________ Mammie RussianHora de finalizacin: ____________  Franco NonesFecha: ____________ Movimientos: ____________ Mammie RussianHora de inicio: ____________ Mammie RussianHora de finalizacin: ____________  Franco NonesFecha: ____________ Movimientos: ____________ Mammie RussianHora de inicio: ____________ Mammie RussianHora de finalizacin: ____________  Franco NonesFecha: ____________ Movimientos: ____________ Mammie RussianHora de inicio: ____________ Mammie RussianHora de finalizacin: ____________  Franco NonesFecha: ____________ Movimientos: ____________ Mammie RussianHora de inicio: ____________ Mammie RussianHora de finalizacin: ____________  Franco NonesFecha: ____________ Movimientos: ____________ Mammie RussianHora de inicio: ____________ Mammie RussianHora de finalizacin: ____________  Franco NonesFecha: ____________ Movimientos: ____________ Mammie RussianHora de inicio: ____________ Mammie RussianHora de finalizacin: ____________  Franco NonesFecha: ____________ Movimientos: ____________ Mammie RussianHora de inicio: ____________ Mammie RussianHora de finalizacin: ____________  Franco NonesFecha: ____________ Movimientos: ____________ Mammie RussianHora de inicio: ____________ Mammie RussianHora de finalizacin: ____________  Franco NonesFecha: ____________ Movimientos: ____________ Mammie RussianHora de inicio: ____________ Mammie RussianHora de finalizacin: ____________  Franco NonesFecha: ____________ Movimientos: ____________ Mammie RussianHora de inicio: ____________ Mammie RussianHora de finalizacin: ____________  Franco NonesFecha: ____________ Movimientos: ____________ Mammie RussianHora de inicio: ____________ Mammie RussianHora de finalizacin: ____________  Franco NonesFecha:  ____________ Movimientos: ____________ Mammie RussianHora de inicio: ____________ Mammie RussianHora de finalizacin: ____________  Franco NonesFecha: ____________ Movimientos: ____________ Mammie RussianHora de inicio: ____________ Mammie RussianHora de finalizacin: ____________  Franco NonesFecha: ____________ Movimientos: ____________ Mammie RussianHora de inicio: ____________ Mammie RussianHora de finalizacin: ____________  Franco NonesFecha: ____________ Movimientos: ____________ Mammie RussianHora de inicio: ____________ Mammie RussianHora de finalizacin: ____________  Franco NonesFecha: ____________ Movimientos: ____________ Mammie RussianHora de inicio: ____________ Mammie RussianHora de finalizacin: ____________  Franco NonesFecha: ____________ Movimientos: ____________ Mammie RussianHora de inicio: ____________ Mammie RussianHora de finalizacin: ____________  Franco NonesFecha: ____________ Movimientos: ____________ Mammie RussianHora de inicio: ____________ Mammie RussianHora de finalizacin: ____________  Franco NonesFecha: ____________ Movimientos: ____________ Mammie RussianHora de inicio: ____________ Mammie RussianHora de finalizacin: ____________  Franco NonesFecha: ____________ Movimientos: ____________ Mammie RussianHora de inicio: ____________ Mammie RussianHora de finalizacin: ____________  Franco NonesFecha: ____________ Movimientos: ____________ Mammie RussianHora de inicio: ____________ Mammie RussianHora de finalizacin: ____________  Franco NonesFecha: ____________ Movimientos: ____________ Mammie RussianHora de inicio: ____________ Mammie RussianHora de finalizacin: ____________  Franco NonesFecha: ____________ Movimientos: ____________ Mammie RussianHora de inicio: ____________ Mammie RussianHora de finalizacin: ____________  Franco NonesFecha: ____________ Movimientos: ____________ Mammie RussianHora de inicio: ____________ Mammie RussianHora de finalizacin: ____________  Franco NonesFecha: ____________ Movimientos: ____________ Mammie RussianHora de inicio: ____________ Mammie RussianHora de finalizacin: ____________  Franco NonesFecha: ____________ Movimientos: ____________ Mammie RussianHora de inicio: ____________ Mammie RussianHora de finalizacin: ____________  Franco NonesFecha: ____________ Movimientos: ____________ Mammie RussianHora de inicio: ____________ Mammie RussianHora de finalizacin: ____________  Franco NonesFecha: ____________ Movimientos: ____________ Mammie RussianHora de inicio: ____________ Mammie RussianHora de finalizacin: ____________  Franco NonesFecha: ____________ Movimientos: ____________ Mammie RussianHora  de inicio: ____________ Mammie RussianHora de finalizacin: ____________  Franco NonesFecha: ____________ Movimientos: ____________ Mammie RussianHora de inicio: ____________ Mammie RussianHora de finalizacin: ____________  Franco NonesFecha: ____________ Movimientos: ____________ Mammie RussianHora de inicio: ____________ Mammie RussianHora de finalizacin: ____________  Franco NonesFecha: ____________ Movimientos: ____________ Mammie RussianHora de inicio: ____________ Mammie RussianHora de finalizacin: ____________  Franco NonesFecha: ____________ Movimientos: ____________ Mammie RussianHora de inicio: ____________ Mammie RussianHora de finalizacin: ____________  Franco NonesFecha: ____________ Movimientos: ____________ Mammie RussianHora de inicio: ____________ Mammie RussianHora de finalizacin: ____________  Fecha: ____________ Movimientos: ____________ Stevan Born inicio: ____________ Stevan Born finalizacin: ____________  Franco Nones: ____________ Movimientos: ____________ Stevan Born inicio: ____________ Stevan Born finalizacin: ____________  Franco Nones: ____________ Movimientos: ____________ Stevan Born inicio: ____________ Stevan Born finalizacin: ____________  Franco Nones: ____________ Movimientos: ____________ Stevan Born inicio: ____________ Stevan Born finalizacin: ____________  Franco Nones: ____________ Movimientos: ____________ Stevan Born inicio: ____________ Stevan Born finalizacin: ____________  Franco Nones: ____________ Movimientos: ____________ Stevan Born inicio: ____________ Mammie Russian de finalizacin: ____________  Franco Nones: ____________ Movimientos: ____________ Stevan Born inicio: ____________ Stevan Born finalizacin: ____________  Franco Nones: ____________ Movimientos: ____________ Stevan Born inicio: ____________ Stevan Born finalizacin: ____________  Franco Nones: ____________ Movimientos: ____________ Stevan Born inicio: ____________ Mammie Russian de finalizacin: ____________    Esta informacin no tiene como fin reemplazar el consejo del mdico. Asegrese de hacerle al mdico cualquier pregunta que tenga.   Document Released: 02/03/2008 Document Revised: 10/13/2012 Elsevier Interactive Patient Education [employer]

## 2015-09-23 NOTE — Anesthesia Procedure Notes (Signed)
Epidural Patient location during procedure: OB Start time: 09/23/2015 9:41 PM End time: 09/23/2015 9:45 PM  Staffing Anesthesiologist: Leilani AbleHATCHETT, Jerline Linzy Performed by: anesthesiologist   Preanesthetic Checklist Completed: patient identified, surgical consent, pre-op evaluation, timeout performed, IV checked, risks and benefits discussed and monitors and equipment checked  Epidural Patient position: sitting Prep: site prepped and draped and DuraPrep Patient monitoring: continuous pulse ox and blood pressure Approach: midline Location: L3-L4 Injection technique: LOR air  Needle:  Needle type: Tuohy  Needle gauge: 17 G Needle length: 9 cm and 9 Needle insertion depth: 6 cm Catheter type: closed end flexible Catheter size: 19 Gauge Catheter at skin depth: 11 cm Test dose: negative and Other  Assessment Sensory level: T9 Events: blood not aspirated, injection not painful, no injection resistance, negative IV test and no paresthesia  Additional Notes Reason for block:procedure for pain

## 2015-09-23 NOTE — MAU Note (Signed)
Notified Zerita Boersarlene Lawson CNM negative Amnisure, cervix 3/100/-1 bloody show received order to keep patient for one hour and recheck cervix. Information was explained to patient through in house Spanish Marshall & Ilsleynterpreter Marta.

## 2015-09-23 NOTE — MAU Note (Signed)
Order received.

## 2015-09-23 NOTE — Progress Notes (Signed)
Assisted registration with interpretation of patient information.  Spanish Interpreter

## 2015-09-23 NOTE — Progress Notes (Signed)
Assisted RN with interpretation of procedure for pain control.  Interpreter was present during epidural procedure.  Spanish Interpreter

## 2015-09-23 NOTE — Progress Notes (Signed)
Assisted RN with interpretation of patient admit to L&D unit.  Spanish Interpreter

## 2015-09-23 NOTE — MAU Note (Signed)
Patient presents with leaking clear fluid since yesterday small amount however continues to leak, having some contractions since this morning rates pain 5/10, small amount of blood on tissue when wipes.  [place].

## 2015-09-23 NOTE — H&P (Signed)
LABOR ADMISSION HISTORY AND PHYSICAL  Brianna Bishop is a 19 y.o. female G1P0000 with IUP at [redacted]w[redacted]d by 53 w U/S presenting for SOL. She reports +FM, + contractions, no blurry vision, headaches or peripheral edema, and RUQ pain. She presented to the MAU the morning of 09/23/15 with complaint of LOF, but amnisure was negative. She reports a small amount of VB in discharge. She plans on breast feeding. She is undecided on birth control.  Dating: By 38w U/S --->  Estimated Date of Delivery: 09/22/15  Sono:    , CWD, normal anatomy, cephalic presentation, longitudinal lie, 2976 g, 41% EFW   Prenatal History/Complications: Late prenatal care. Transferring care from [place].   Past Medical History: Past Medical History  Diagnosis Date  . Gastritis     Past Surgical History: Past Surgical History  Procedure Laterality Date  . No past surgeries      Obstetrical History: OB History    Gravida Para Term Preterm AB TAB SAB Ectopic Multiple Living        Social History: Social History   Social History  . Marital Status: Single    Spouse Name: N/A  . Number of Children: N/A  . Years of Education: N/A   Social History Main Topics  . Smoking status: Never Smoker   . Smokeless tobacco: Never Used  . Alcohol Use: No  . Drug Use: No  . Sexual Activity: Not Currently    Birth Control/ Protection: None   Other Topics Concern  . None   Social History Narrative    Family History: Family History  Problem Relation Age of Onset  . Cancer Maternal Grandmother   . Diabetes Paternal Grandmother     Allergies: No Known Allergies  Prescriptions prior to admission  Medication Sig Dispense Refill Last Dose  . Prenatal Vit-Fe Fumarate-FA (MULTIVITAMIN-PRENATAL) 27-0.8 MG TABS tablet Take 1 tablet by mouth daily at 12 noon.   09/22/2015 at Unknown time     Review of Systems   All systems reviewed and negative except as stated in HPI  BP 99/54 mmHg   Pulse 87  Temp(Src) 98 F (36.7 C) (Oral)  Resp 16  Ht 4' 10.2" (1.478 m)  Wt 50.168 kg (110 lb 9.6 oz)  BMI 22.97 kg/m2  SpO2 100% General appearance: alert, cooperative, appears stated age and mild distress Lungs: clear to auscultation bilaterally Heart: regular rate and rhythm Abdomen: soft, non-tender; bowel sounds normal Pelvic: Cervix dilated to 4 cm.  Extremities: Homans sign is negative, no sign of DVT, edema Presentation: cephalic by Leopold's maneuver Fetal monitoringBaseline: 130 bpm, Variability: Good {> 6 bpm), Accelerations: Reactive and Decelerations: Absent Uterine activity Frequency: Every 2-4 minutes Dilation: 4 Effacement (%): 100, 90 Station: -1 Exam by:: D Simpson RN   Prenatal labs: ABO, Rh: A/Positive/-- (10/04 0000) Antibody: Negative (10/04 0000) Rubella: Immune RPR: Nonreactive (10/04 0000)  HBsAg: Negative (10/04 0000)  HIV: Non-reactive (10/04 0000)  GBS: Negative (10/12 0000)  1 hr Glucola: 98 Genetic screening:  Late to care Anatomy US: wnl save for humerus < 5th% (likely constitutional) (@ 38 wks)  Prenatal Transfer Tool  Maternal Diabetes: No Genetic Screening: Late to care Maternal Ultrasounds/Referrals: Normal Fetal Ultrasounds or other Referrals:  None Maternal Substance Abuse:  No Significant Maternal Medications:  None Significant Maternal Lab Results: Lab values include: Group B Strep negative  Results for orders placed or performed during the hospital encounter of 09/23/15 (from the  past 24 hour(s))  [facility]   Collection Time: 09/23/15  9:20 AM  Result Value Ref Range   Amnisure ROM NEGATIVE     Patient Active Problem List   Diagnosis Date Noted  . Late prenatal care affecting pregnancy in third trimester 09/03/2015    Assessment: Brianna Bishop is a 19 y.o. G1P0000 at 3157w1d here for SOL.   #Labor: Expectant management #Pain: Planning on epidural #FWB: Cat I #ID:  GBS neg #MOF:  Breast #MOC: Undecided #Circ:  N/A (female)  Dani GobbleHillary Fitzgerald, MD [facility], PGY-1

## 2015-09-23 NOTE — Progress Notes (Signed)
Assisted MD with interpretation of patient assessment Spanish Interpreter

## 2015-09-23 NOTE — Anesthesia Preprocedure Evaluation (Signed)
Anesthesia Evaluation  Patient identified by MRN, date of birth, ID band Patient awake    Reviewed: Allergy & Precautions, H&P , Patient's Chart, lab work & pertinent test results  Airway Mallampati: I  TM Distance: >3 FB Neck ROM: full    Dental no notable dental hx.    Pulmonary neg pulmonary ROS,    Pulmonary exam normal        Cardiovascular negative cardio ROS Normal cardiovascular exam     Neuro/Psych negative neurological ROS  negative psych ROS   GI/Hepatic negative GI ROS, Neg liver ROS,   Endo/Other  negative endocrine ROS  Renal/GU negative Renal ROS     Musculoskeletal   Abdominal Normal abdominal exam  (+)   Peds  Hematology negative hematology ROS (+)   Anesthesia Other Findings   Reproductive/Obstetrics (+) Pregnancy                             Anesthesia Physical Anesthesia Plan  ASA: II  Anesthesia Plan: Epidural   Post-op Pain Management:    Induction:   Airway Management Planned:   Additional Equipment:   Intra-op Plan:   Post-operative Plan:   Informed Consent: I have reviewed the patients History and Physical, chart, labs and discussed the procedure including the risks, benefits and alternatives for the proposed anesthesia with the patient or authorized representative who has indicated his/her understanding and acceptance.     Plan Discussed with:   Anesthesia Plan Comments:         Anesthesia Quick Evaluation  

## 2015-09-23 NOTE — MAU Note (Signed)
Contractions every 5 mins. Seen today and was 3cm. Denies LOF. Having some bloody mucus. +FM

## 2015-09-24 ENCOUNTER — Encounter (HOSPITAL_COMMUNITY): Payer: Self-pay

## 2015-09-24 DIAGNOSIS — Z3A40 40 weeks gestation of pregnancy: Secondary | ICD-10-CM

## 2015-09-24 LAB — ABO/RH: ABO/RH(D): A POS

## 2015-09-24 LAB — RPR: RPR Ser Ql: NONREACTIVE

## 2015-09-24 MED ORDER — ACETAMINOPHEN 325 MG PO TABS: 650.0000 mg | ORAL_TABLET | ORAL | Status: DC | PRN

## 2015-09-24 MED ORDER — PRENATAL MULTIVITAMIN CH
1.0000 | ORAL_TABLET | Freq: Every day | ORAL | Status: DC
  Administered 2015-09-25 – 2015-09-26 (×2): 1 via ORAL
  Filled 2015-09-24: qty 1

## 2015-09-24 MED ORDER — BENZOCAINE-MENTHOL 20-0.5 % EX AERO
1.0000 "application " | INHALATION_SPRAY | CUTANEOUS | Status: DC | PRN
  Administered 2015-09-24: 1 via TOPICAL
  Filled 2015-09-24: qty 56

## 2015-09-24 MED ORDER — ONDANSETRON HCL 4 MG PO TABS: 4.0000 mg | ORAL_TABLET | ORAL | Status: DC | PRN

## 2015-09-24 MED ORDER — DIBUCAINE 1 % RE OINT: 1.0000 "application " | TOPICAL_OINTMENT | RECTAL | Status: DC | PRN

## 2015-09-24 MED ORDER — ONDANSETRON HCL 4 MG/2ML IJ SOLN: 4.0000 mg | INTRAMUSCULAR | Status: DC | PRN

## 2015-09-24 MED ORDER — DOCUSATE SODIUM 100 MG PO CAPS
100.0000 mg | ORAL_CAPSULE | Freq: Two times a day (BID) | ORAL | Status: DC
  Administered 2015-09-24 – 2015-09-26 (×4): 100 mg via ORAL
  Filled 2015-09-24 (×4): qty 1

## 2015-09-24 MED ORDER — VITAMIN K1 1 MG/0.5ML IJ SOLN
INTRAMUSCULAR | Status: AC
  Filled 2015-09-24: qty 0.5

## 2015-09-24 MED ORDER — IBUPROFEN 600 MG PO TABS
600.0000 mg | ORAL_TABLET | Freq: Four times a day (QID) | ORAL | Status: DC
  Administered 2015-09-24 – 2015-09-26 (×9): 600 mg via ORAL
  Filled 2015-09-24 (×8): qty 1

## 2015-09-24 MED ORDER — WITCH HAZEL-GLYCERIN EX PADS: 1.0000 "application " | MEDICATED_PAD | CUTANEOUS | Status: DC | PRN

## 2015-09-24 MED ORDER — LANOLIN HYDROUS EX OINT: TOPICAL_OINTMENT | CUTANEOUS | Status: DC | PRN

## 2015-09-24 MED ORDER — SIMETHICONE 80 MG PO CHEW: 80.0000 mg | CHEWABLE_TABLET | ORAL | Status: DC | PRN

## 2015-09-24 MED ORDER — OXYCODONE-ACETAMINOPHEN 5-325 MG PO TABS
1.0000 | ORAL_TABLET | ORAL | Status: DC | PRN
  Administered 2015-09-25: 1 via ORAL
  Filled 2015-09-24: qty 1

## 2015-09-24 MED ORDER — DIPHENHYDRAMINE HCL 25 MG PO CAPS: 25.0000 mg | ORAL_CAPSULE | Freq: Four times a day (QID) | ORAL | Status: DC | PRN

## 2015-09-24 MED ORDER — OXYTOCIN 40 UNITS IN LACTATED RINGERS INFUSION - SIMPLE MED: 62.5000 mL/h | INTRAVENOUS | Status: DC | PRN

## 2015-09-24 NOTE — Anesthesia Postprocedure Evaluation (Signed)
  Anesthesia Post-op Note  Patient: Brianna Bishop  Procedure(s) Performed: * No procedures listed *  Patient Location: PACU and Mother/Baby  Anesthesia Type:Epidural  Level of Consciousness: awake, alert  and oriented  Airway and Oxygen Therapy: Patient Spontanous Breathing  Post-op Pain: mild  Post-op Assessment: Post-op Vital signs reviewed              Post-op Vital Signs: Reviewed and stable  Last Vitals:  Filed Vitals:   09/24/15 1426  BP: 93/76  Pulse: 81  Temp: 36.8 C  Resp: 18    Complications: No apparent anesthesia complications

## 2015-09-24 NOTE — Lactation Note (Signed)
This note was copied from the chart of Brianna NamibiaMariana Entwistle. Lactation Consultation Note  Spanish Interpreter present. P1, 3 hours old . Reviewed hand expression and mother easily expressed drops. Attempted in football hold and baby was too sleepy to latch but did latch in L&D. Reviewed basics, cluster feeding. Mom encouraged to feed baby 8-12 times/24 hours and with feeding cues.  Mom made aware of O/P services, breastfeeding support groups, community resources, and our phone # for post-discharge questions in Spanish.      Patient Name: Brianna Bishop Today's Date: 09/24/2015 Reason for consult: Initial assessment   Maternal Data    Feeding Feeding Type: Breast Fed Length of feed: 35 min  LATCH Score/Interventions Latch: Grasps breast easily, tongue down, lips flanged, rhythmical sucking.  Audible Swallowing: Spontaneous and intermittent  Type of Nipple: Everted at rest and after stimulation  Comfort (Breast/Nipple): Soft / non-tender     Hold (Positioning): No assistance needed to correctly position infant at breast.  LATCH Score: 10  Lactation Tools Discussed/Used     Consult Status      Brianna Bishop, Brianna Bishop 09/24/2015, 3:47 PM

## 2015-09-24 NOTE — Progress Notes (Signed)
Patient ID: Heriberto AntiguaMariana Donati, female   DOB: 09-08-1996, 19 y.o.   MRN: [id number] Doing well, feeling some rectal pressure all the time now  Filed Vitals:   09/24/15 0735 09/24/15 0800 09/24/15 0830 09/24/15 0904  BP: 99/53 117/69 126/67 118/48  Pulse: 90 93 88 131  Temp: 97.7 F (36.5 C)     TempSrc: Oral     Resp: 16     Height:      Weight:      SpO2:        FHR reactive UCs every 2-3 min  Dilation: 10 Dilation Complete Date: 09/24/15 Dilation Complete Time: 0854 Effacement (%): 100 Cervical Position: Middle Station: +1 Presentation: Vertex Exam by:: Wynelle BourgeoisMarie Chaquita Basques, CNM  Will have her start pushing

## 2015-09-25 ENCOUNTER — Other Ambulatory Visit: Payer: Medicaid Other

## 2015-09-25 NOTE — Progress Notes (Signed)
CLINICAL SOCIAL WORK MATERNAL/CHILD NOTE  Patient Details  Name: Brianna Bishop MRN: [id number] Date of Birth: 09/24/2016  Date:  09/25/2015  Clinical Social Worker Initiating Note:  Elissa Hefty, MSW intern   Date/ Time Initiated:  09/25/15/0930     Child's Name:  Brianna Bishop    Legal Guardian:  Brianna Bishop    Need for Interpreter:  Spanish   Date of Referral:  09/24/15     Reason for Referral:  Late or No Prenatal Care    Referral Source:  [facility]   Address:  [address] [place], [place] [address]  Phone number:  [phone]   Household Members:  Self, Minor Children, Siblings   Natural Supports (not living in the home):  Immediate Family   Professional Supports: None   Employment: Unemployed   Type of Work:     Education:  Database administrator Resources:  Medicaid   Other Resources:  [facility]   Cultural/Religious Considerations Which May Impact Care:  None Reported   Strengths:  Ability to meet basic needs , Home prepared for child    Risk Factors/Current Problems:  Late prenatal care- MOB reported living in [place] but having limited transportation and support to get to her appointments. MOB also stated he had limited Medicaid coverage and for the reason moved to [place] with her older sister. She was able to continue her prenatal care at the [facility] clinic for the last month of her pregnancy.    Cognitive State:  Insightful , Linear Thinking , Able to Concentrate , Goal Oriented    Mood/Affect:  Happy , Interested , Bright , Calm , Comfortable    CSW Assessment:  MSW intern entered patient's room due to a consult being placed because of late prenatal care. MOB's sister was present in the room and was actively engaged during the assessment. MOB spoke only Spanish and therefore the assessment was conducted in Spanish by the MSW intern. MOB provided verbal consent for her sister to be in the room and  answer questions for her during the assessment. MOB presented to be in a happy mood as evidence by her smiling during the assessment and openly answering questions. Per MOB, the birthing process was long and painful. She stated she was in labor for a full day and pushed for three hours until her daughter was born. MOB voiced having a level 8 of back pain to her RN where the epidural was placed. Her RN provided her with a Percocet and asked MSW intern to interpret for her. MOB voiced interest in breast and formula feeding and agreed to asking for help with breastfeeding during the infant's next feeding. MOB's sister inquired about [facility] and scheduling the appointment for MOB to get formula and add the infant to her Medicaid.  MOB reported living with her sister and moving to [place] a month ago from [place]. According to [facility], she lived with another sister in [place] but decided to move because she had limited support and transportation there. MOB shared she feels well supported by her sister here and has met all of the infant's basic needs. According to MOB's sister they live with her husband and two children. There is no other family members in the area per sister. Per MOB, FOB is not involved and lives in [place]. MOB shared she attended a party in [place] where she met FOB and got pregnant. MOB voiced that FOB knows about the baby but has  not showed interest in being involved. MOB reported she is okay with him not being involved and is happy with the support she has from her sister.   MSW intern inquired about MOB's mental health during the pregnancy. MOB shared she would be tearful at times but did not voice any mental health concerns. MOB denied a history of any mental health issues. Per MOB, she would cry when she saw something sad on TV or just because but never to the point were it affected her daily functioning. MSW intern provided education on perinatal mood disorders. MOB agreed to contact her OB if needs  arise but denied having any concerns or further questions.   MSW intern served as an Astronomer for the RN providing information about the nurse home visits.   MSW intern inquired about MOB's prenatal care during the pregnancy. MOB shared she was able to go 2-3 times to her OB in [place] but because of transportation and Medicaid coverage issues had very limited prenatal care. MOB's sister shared that their other sister does not have a vehicle and was not supportive of MOB and therefore she decided to have her move to [facility] and since then has made sure she went to all her prenatal appointments during the last month of her pregnancy. MOB's sister got her Medicaid taken care off and voiced she will be making the rest of MOB's appointments with her medical providers along with the infant's. MSW intern asked about their pediatrician of choice and her sister stated they were not sure yet. MSW intern shared information about [facility] [place] and stated she would provide them with their number and information for them to schedule an appointment if they were interested. MSW intern inquired about substance use during the pregnancy. MOB denied using any substances during the pregnancy. MSW intern informed MOB and her sister about the hospital's drug screening policy and the UDS and MDS results still pending. MOB stated she was not concerned about the results and was okay with the hospital's policy. Her sister voiced understanding the policy and denied any feelings of concern in regard to the results. MOB's sister emphasized her limited prenatal care was because of lack of support, Medicaid coverage, and transportation issues in [place]. MOB and her sister denied having any further questions but were appreciative about the information provided. MOB agreed to contact CSW if needs arise.   CSW Plan/Description:   Engineer, mining- MSW intern provided education on perinatal mood disorders. CSW to monitor  UDS and MDS and file a CPS report if one comes back positive.  No Further Intervention Required/No Barriers to Discharge   Trevor Iha, Student-SW 09/25/2015, 10:07 AM

## 2015-09-25 NOTE — Progress Notes (Signed)
UR chart review completed.  

## 2015-09-25 NOTE — Progress Notes (Signed)
Post Partum Day 1 Subjective:  Brianna Bishop is a 19 y.o. G1P1001 1826w2d s/p NSVD.  No acute events overnight.  Pt denies problems with ambulating, voiding or po intake.  She denies nausea or vomiting.  Pain is well controlled.  She has had flatus.  Lochia Small.  Plan for birth control is Undecided.  Method of Feeding: Breast/Bottle  Objective: Blood pressure 97/56, pulse 90, temperature 98.1 F (36.7 C), temperature source Oral, resp. rate 16, height 4' 10.2" (1.478 m), weight 110 lb 9.6 oz (50.168 kg), SpO2 100 %, unknown if currently breastfeeding.  Physical Exam:  General: alert, cooperative and no distress Lochia:normal flow Chest: normal WOB Heart: Regular rate Abdomen: +BS, soft, mild TTP (appropriate) Uterine Fundus: firm DVT Evaluation: No evidence of DVT seen on physical exam. Extremities: no edema   Recent Labs  09/23/15 2045  HGB 14.5  HCT 41.8    Assessment/Plan:  ASSESSMENT: Brianna AntiguaMariana Henriquez is a 19 y.o. G1P1001 5926w2d s/p NSVD  Plan for discharge tomorrow, today if infant is abel to be discharged per mother's request.  Continue routine PP care Breastfeeding support PRN  LOS: 2 days   Federico FlakeKimberly Niles Vetra Shinall 09/25/2015, 10:55 AM

## 2015-09-25 NOTE — Progress Notes (Signed)
Post Partum Day 1 Subjective:  Brianna Bishop is a 19 y.o. G1P1001 2229w2d s/p NSVD.  No acute events overnight.  Pt denies problems with ambulating, voiding or po intake.  She denies nausea or vomiting but has not eaten since the delivery.  Pain is well controlled.  She has had flatus.  Lochia is minimal.  She has information about birth control options but still wants time to decide on which one.  She does not have questions about birth control. Method of feeding is breast and bottle.  Objective: Blood pressure 97/56, pulse 90, temperature 98.1 F (36.7 C), temperature source Oral, resp. rate 16, height 4' 10.2" (1.478 m), weight 50.168 kg (110 lb 9.6 oz), SpO2 100 %, unknown if currently breastfeeding.  Physical Exam:  General: alert, cooperative and no distress Lochia: normal flow Chest: normal WOB, clear to auscultation on frontal lung fields Heart: regular rate and rhythm, normal S1S2 Abdomen: +BS, soft, appropriately tender Uterine Fundus: firm, located at level of umbilicus Extremities: no edema   Recent Labs  09/23/15 2045  HGB 14.5  HCT 41.8    Assessment/Plan:  ASSESSMENT: Brianna Bishop is a 19 y.o. G1P1001 3429w2d s/p NSVD.  Will plan for discharge tomorrow. Patient has no complaints today. Continue routine postpartum care.  Breastfeeding support PRN   LOS: 2 days   Gabriel RungKristen Westfall, MS3 09/25/2015, 10:57 AM   OB fellow attestation:  I have seen and examined this patient; I agree with above documentation in the medical student's note. Please see progress note from same date for official documentation  Federico FlakeKimberly Niles Chinwe Lope, MD 10:25 AM

## 2015-09-25 NOTE — Lactation Note (Signed)
This note was copied from the chart of Brianna NamibiaMariana Pepin. Lactation Consultation Note  Patient Name: Brianna Bishop ZOXWR'UToday's Date: 09/25/2015 Reason for consult: Follow-up assessment    With this first time mom of a term baby, now 3625 hours old. Mom has fed 15 ml's of formula to baby last 3 feedings. When I went in the room, mom was holding a sleeping baby. Mom said the baby odes not want to breast feed. I told mom it was because she has been feeding formula, and this fills the baby more, and the baby is not hungry. I told mom the baby just needs colostrum  now, and mom's milk will transition in  at 48-72 hours. Mom knows to call for help with latch. I used the phone interpreter, Gunnar Fusiaula, # [phone].    Maternal Data    Feeding Feeding Type: Breast Fed Length of feed: 15 min  LATCH Score/Interventions Latch: Grasps breast easily, tongue down, lips flanged, rhythmical sucking.  Audible Swallowing: Spontaneous and intermittent  Type of Nipple: Everted at rest and after stimulation  Comfort (Breast/Nipple): Soft / non-tender     Hold (Positioning): Assistance needed to correctly position infant at breast and maintain latch. Intervention(s): Support Pillows;Skin to skin  LATCH Score: 9  Lactation Tools Discussed/Used     Consult Status Consult Status: Follow-up Date: 09/26/15 Follow-up type: In-patient    Brianna Bishop, Brianna Bishop 09/25/2015, 2:33 PM

## 2015-09-26 ENCOUNTER — Ambulatory Visit: Payer: Self-pay

## 2015-09-26 MED ORDER — IBUPROFEN 600 MG PO TABS: 600.0000 mg | ORAL_TABLET | Freq: Four times a day (QID) | ORAL | Status: DC

## 2015-09-26 NOTE — Discharge Instructions (Signed)

## 2015-09-26 NOTE — Discharge Summary (Signed)
OB Discharge Summary     Patient Name: Brianna Bishop DOB: 1996-04-07 MRN: [id number]  Date of admission: 09/23/2015 Delivering MD: Lyndel Safe NILES   Date of discharge: 09/26/2015  Admitting diagnosis: 40 WKS, PAIN Intrauterine pregnancy: [redacted]w[redacted]d     Secondary diagnosis:  Principal Problem:   NSVD (normal spontaneous vaginal delivery) Active Problems:   Late prenatal care affecting pregnancy in third trimester   Active labor  Additional problems: none     Discharge diagnosis: Term Pregnancy Delivered                                                                                                Post partum procedures:nonw  Augmentation: none  Complications: None  Hospital course:  Onset of Labor With Vaginal Delivery     19 y.o. yo G1P1001 at [redacted]w[redacted]d was admitted in Active Laboron 09/23/2015. Patient had an uncomplicated labor course as follows:  Membrane Rupture Time/Date: 7:30 AM ,09/24/2015   Intrapartum Procedures: Episiotomy: None [1]                                         Lacerations:  2nd degree [3]  Patient had a delivery of a Viable infant. 09/24/2015  Information for the patient's newborn:  Addis, Bennie [[id number]]  Delivery Method: Vaginal, Spontaneous Delivery (Filed from Delivery Summary)    Pateint had an uncomplicated postpartum course.  She is ambulating, tolerating a regular diet, passing flatus, and urinating well. Patient is discharged home in stable condition on No discharge date for patient encounter.Marland Kitchen    Physical exam  Filed Vitals:   09/25/15 0330 09/25/15 0615 09/25/15 1834 09/26/15 0621  BP: 100/53 97/56 92/38  94/50  Pulse: 63 90 77 69  Temp: 97.9 F (36.6 C) 98.1 F (36.7 C) 98.3 F (36.8 C) 98.4 F (36.9 C)  TempSrc: Oral Oral Oral Oral  Resp: Height:      Weight:      SpO2:       General: alert, cooperative and no distress Lochia: appropriate Uterine Fundus: firm Incision: N/A DVT Evaluation: No  evidence of DVT seen on physical exam. Labs: Lab Results  Component Value Date   WBC 11.3* 09/23/2015   HGB 14.5 09/23/2015   HCT 41.8 09/23/2015   MCV 85.5 09/23/2015   PLT 171 09/23/2015   No flowsheet data found.  Discharge instruction: per After Visit Summary and "Baby and Me Booklet".  After visit meds:    Medication List    STOP taking these medications        multivitamin-prenatal 27-0.8 MG Tabs tablet      TAKE these medications        ibuprofen 600 MG tablet  Commonly known as:  ADVIL,MOTRIN  Take 1 tablet (600 mg total) by mouth every 6 (six) hours.        Diet: routine diet  Activity: Advance as tolerated. Pelvic rest for 6 weeks.   Outpatient follow up:4 weeks Follow up Appt:Future  Appointments Date Time Provider Department Center  11/06/2015 1:00 PM Hurshel PartyLisa A Leftwich-Kirby, CNM [facility]   Follow up Visit:No Follow-up on file.  Postpartum contraception: Nexplanon  Newborn Data: Live born female  Birth Weight: 6 lb 5.4 oz (2875 g) APGAR: 9, 9  Baby Feeding: Bottle and Breast Disposition:home with mother   09/26/2015 Greig RightRESENZO-DISHMAN,Jamarr Treinen, CNM

## 2015-09-26 NOTE — Lactation Note (Signed)
This note was copied from the chart of Brianna NamibiaMariana Neuhaus. Lactation Consultation Note: Kennyth Loseacifica Spanish Interpreter ID # [id number] on phone for all teaching. Reviewed breastfeeding information in Spanish Baby and Me book, assist mother with hand expression. Observed large amts of colostrum. Infant resistant to latch at first due to so many bottles. Advised mother to limit formula. Assist mother with firming her nipple tissue with her fingers. Infant sustained latch with good burst of suckling and swallows for 15mins and another feeding observed for20 mins. Mother was given a hand pump by staff nurse. Reviewed use and advised mother in treatment to prevent engorgement . Mother advised to feed infant with feeding cues, and at least 8-12 times in 24 hours. Discussed cluster feeding. Mother receptive to all teaching and happy that infant has had a good feeding. [facility] Mother is aware of available LC services as needed.  Patient Name: Brianna Bishop Thunder [id number]'WToday's Date: 09/26/2015 Reason for consult: Follow-up assessment   Maternal Data    Feeding Feeding Type: Breast Fed Length of feed: 10 min  LATCH Score/Interventions Latch: Repeated attempts needed to sustain latch, nipple held in mouth throughout feeding, stimulation needed to elicit sucking reflex. Intervention(s): Adjust position;Breast compression  Audible Swallowing: Spontaneous and intermittent Intervention(s): Skin to skin Intervention(s): Skin to skin;Hand expression  Type of Nipple: Everted at rest and after stimulation  Comfort (Breast/Nipple): Soft / non-tender     Hold (Positioning): Assistance needed to correctly position infant at breast and maintain latch. Intervention(s): Support Pillows;Position options  LATCH Score: 8  Lactation Tools Discussed/Used     Consult Status Consult Status: Follow-up Date: 09/26/15 Follow-up type: In-patient    Stevan BornKendrick, [place] Inthavong  [place] 09/26/2015, 2:11 PM

## 2015-09-28 ENCOUNTER — Ambulatory Visit (HOSPITAL_COMMUNITY)
Admission: RE | Admit: 2015-09-28 | Discharge: 2015-09-28 | Disposition: A | Discharge status: Home / Self Care | Payer: Medicaid Other | Source: Ambulatory Visit | Attending: Obstetrics & Gynecology | Admitting: Obstetrics & Gynecology

## 2015-09-28 NOTE — Lactation Note (Signed)
L[facility]ation [facility] & Brianna Bishop (DOB: [birth year])  Mother's reason for visit: "Baby doesn't want to take the breast" Consult:  Initial Lactation Consultant:  Remigio Eisenmengerichey, Kanyah Matsushima [place]  ________________________________________________________________________ BW: [phone] (6# 5.4oz)    D/C wt: 6045W2675g Wt 09-27-15: 2693g (5# 15 oz) Today's weight: 2706g (5# 15.5oz) (Now 5.8% below BW)  ________________________________________________________________________  Mother's Name: Brianna AntiguaMariana Bishop Type of delivery:  NSVD Breastfeeding Experience: primip Maternal Medical Conditions:  None Maternal Medications: IB, PNV  ________________________________________________________________________  Breastfeeding History (Post Discharge)  Frequency of breastfeeding: no good latches since hospital     Duration of feeding: N/A  Pumping  Type of pump:  Manual Frequency:  Tid-qid x 5-10 min Volume:  60 ml  2 bottles last night, 2-3 oz of EBM/formula Infant Intake and Output Assessment  Voids: 5 in 24 hrs.  Color:  Clear yellow Stools: 3 in 24 hrs.  Color:  Yellow  ________________________________________________________________________  Maternal Breast Assessment  Breast:  Full Nipple:  Erect _______________________________________________________________________ Feeding Assessment/Evaluation  Initial feeding assessment:  Infant's oral assessment:  WNL   Attached assessment:  Deep  Lips flanged:  Yes.      Suck assessment:  Displays both  Tools:  Nipple shield 20 mm Instructed on use and cleaning of tool:  Yes.    Pre-feed weight: 2706 g   Post-feed weight: 2716 g  Amount transferred: 10 ml Amount supplemented: 0.5 ml of Similac Advance to prefill NS L breast, cross-cradle, 17 min  Pre-feed weight: 2716g   Post-feed weight: 2726 g  Amount transferred: 10 ml Amount supplemented: 0.5 ml of Similac Advance to prefill NS R breast, cross-cradle &  football  Pre-feed weight: 2726g   Post-feed weight: 2726 g  Amount transferred: 0 ml Amount supplemented: 0.5 ml of Similac Advance to prefill NS L breast, cross-cradle  Total amount transferred: 20 ml Total supplement given: about 13 ml  Interpreter, Elane FritzBlanca, present for entire consult. Per Mom, baby would not latch to the breast at home. B/c they had been encouraged to offer the breast first, Mom was sometimes spending 1-2 hours attempting to latch the baby. Brianna Bishop, exhausted from crying, etc., would then only take a couple of swallows from a bottle before falling asleep.  Bottle feedings of any sufficient volume (2-3 oz) would take place at night. Brianna Bishop [facility]. She is 5.8% below BW.  [name] milk is in. Her nipples are intact except for a small compression stripe on her R nipple. I observed Brianna Bishop at the bare breast; she is unable to latch to [name] anatomy after having received so many bottles. A nipple shield (size 20) was added & prefilled w/a small amount of Similac. Brianna Bishop latched w/ease. The mom was taught signs/sound of swallowing & she was able to identify swallows from Praes[place]elyn.  Although swallows were consistently heard for the first half of the feedings, she did not transfer as much as I had anticipated. My sense is that as she becomes used to feeding at the breast, the feedings will become more efficient. Mom taught how to massage the breast during feeds to increase flow.   I attempted to teach the mom how to cup-feed any supplement (so as to improve baby's behavior at breast). However, Mom was not comfortable with this. Paced-bottle feeding was taught to mom & her sister, so that baby would not get used to such quick feedings (bottle-feeding for Brianna Bishop has been taking about 5 min; they were encouraged to  sit her upright & pace her, so that feedings would be around 10-15 minutes).   Mom encouraged to pump or feed baby when her breasts feel  uncomfortably full.   All questions were answered & the mother was pleased that Brianna Bishop was able to feed at the breast. Mother reassured about her milk supply. The mother's sister's questions were also answered. Mom encouraged to call w/any questions.  Mom to return for a peds visit tomorrow at [facility].   Glenetta Hew, RN, IBCLC

## 2015-10-08 NOTE — H&P (Signed)
Brianna Bishop, CNM Certified Nurse Midwife [facility] H&P 09/23/2015 8:29 PM    Expand All Collapse All   LABOR ADMISSION HISTORY AND PHYSICAL  Brianna Bishop is a 19 y.o. female G1P0000 with IUP at 6170w1d by 2138 w U/S presenting for SOL. She reports +FM, + contractions, no blurry vision, headaches or peripheral edema, and RUQ pain. She presented to the [facility] the morning of 09/23/15 with complaint of LOF, but amnisure was negative. She reports a small amount of VB in discharge. She plans on breast feeding. She is undecided on birth control.  Dating: By 38w U/S ---> Estimated Date of Delivery: 09/22/15  Sono:   @[redacted]w[redacted]d , CWD, normal anatomy, cephalic presentation, longitudinal lie, 2976 g, 41% EFW   Prenatal History/Complications: Late prenatal care. Transfer[place] care from [place].   Past Medical History: Past Medical History  Diagnosis Date  . Gastritis     Past Surgical History: Past Surgical History  Procedure Laterality Date  . No past surgeries      Obstetrical History: OB History    Gravida Para Term Preterm AB TAB SAB Ectopic Multiple Living   1 0 0 0 0 0 0 0 0 0       Social History: Social History   Social History  . Marital Status: Single    Spouse Name: N/A  . Number of Children: N/A  . Years of Education: N/A   Social History Main Topics  . Smoking status: Never Smoker   . Smokeless tobacco: Never Used  . Alcohol Use: No  . Drug Use: No  . Sexual Activity: Not Currently    Birth Control/ Protection: None   Other Topics Concern  . None   Social History Narrative    Family History: Family History  Problem Relation Age of Onset  . Cancer Maternal Grandmother   . Diabetes Paternal Grandmother     Allergies: No Known Allergies  Prescriptions prior to admission  Medication Sig Dispense Refill Last Dose  . Prenatal  Vit-Fe Fumarate-FA (MULTIVITAMIN-PRENATAL) 27-0.8 MG TABS tablet Take 1 tablet by mouth daily at 12 noon.   09/22/2015 at Unknown time     Review of Systems   All systems reviewed and negative except as stated in HPI  BP 99/54 mmHg  Pulse 87  Temp(Src) 98 F (36.7 C) (Oral)  Resp 16  Ht 4' 10.2" (1.478 m)  Wt 50.168 kg (110 lb 9.6 oz)  BMI 22.97 kg/m2  SpO2 100% General appearance: alert, cooperative, appears stated age and mild distress Lungs: clear to auscultation bilaterally Heart: regular rate and rhythm Abdomen: soft, non-tender; bowel sounds normal Pelvic: Cervix dilated to 4 cm.  Extremities: Homans sign is negative, no sign of DVT, edema Presentation: cephalic by Leopold's maneuver Fetal monitoringBaseline: 130 bpm, Variability: Good {> 6 bpm), Accelerations: Reactive and Decelerations: Absent Uterine activity Frequency: Every 2-4 minutes Dilation: 4 Effacement (%): 100, 90 Station: -1 Exam by:: D Simpson RN   Prenatal labs: ABO, Rh: A/Positive/-- (10/04 0000) Antibody: Negative (10/04 0000) Rubella: Immune RPR: Nonreactive (10/04 0000)  HBsAg: Negative (10/04 0000)  HIV: Non-reactive (10/04 0000)  GBS: Negative (10/12 0000)  1 hr Glucola: 98 Genetic screening: Late to care Anatomy US: wnl save for humerus < 5th% (likely constitutional) (@ 38 wks)  Prenatal Transfer Tool  Maternal Diabetes: No Genetic Screening: Late to care Maternal Ultrasounds/Referrals: Normal Fetal Ultrasounds or other Referrals: None Maternal Substance Abuse: No Significant Maternal Medications: None Significant Maternal Lab Results: Lab values include: Group  B Strep negative  Results for orders placed or performed during the hospital encounter of 09/23/15 (from the past 24 hour(s))  Amnisure rupture of membrane (rom)not at [facility]   Collection Time: 09/23/15 9:20 AM  Result Value Ref Range   Amnisure ROM NEGATIVE     Patient Active Problem List     Diagnosis Date Noted  . Late prenatal care affecting pregnancy in third trimester 09/03/2015    Assessment: Brianna Bishop is a 19 y.o. G1P0000 at [redacted]w[redacted]d here for SOL.   #Labor: Expectant management #Pain:Planning on epidural #FWB:Cat I #ID: GBS neg #MOF: Breast #MOC: Undecided #Circ: N/A (female)  Dani Gobble, MD [facility], PGY-1           Revision History     Date/Time User Provider Type Action   10/08/2015 8:49 AM Brianna Morita, CNM Certified Nurse Midwife Sign   09/23/2015 9:04 PM Hillary Percell [place], MD Resident Sign   View Details Report

## 2015-11-06 ENCOUNTER — Ambulatory Visit: Payer: Medicaid Other | Admitting: Advanced Practice Midwife

## 2015-11-11 NOTE — L&D Delivery Note (Signed)
20 y.o. G2P1001 at 5915w0d delivered a viable female infant in cephalic, LOA position.  nuchal cord x 1, easily reduced. right anterior shoulder delivered with ease. 60sec delayed cord clamping. Cord clamped x2 and cut. Placenta delivered spontaneously intact, with 3VC. Fundus firm on exam with massage and pitocin. Good hemostasis noted.  Laceration: 1st degree midline Suture: 3-0 Vicryl EBL: 100 Good hemostasis noted.  Mom and baby recovering in LDR.    Apgars: 8,9  Weight: pending, skin to skin, see delivery summary  Cord blood obtained: Yes   WALLACE, NOAH I, DO PGY-3 10/15/2016, 3:45 PM   OB FELLOW DELIVERY ATTESTATION  I was gloved and present for the delivery in its entirety, and I agree with the above resident's note.    Ernestina PennaNicholas Sissi Padia, MD 4:32 PM

## 2016-01-05 IMAGING — US US MFM OB COMP +14 WKS
1 series · 14 of 28 positions shown · non-contrast
Comparison: none

[Series 1: us mfm ob comp +14 wks · 14 of 100 slices shown]
[im 4/100]
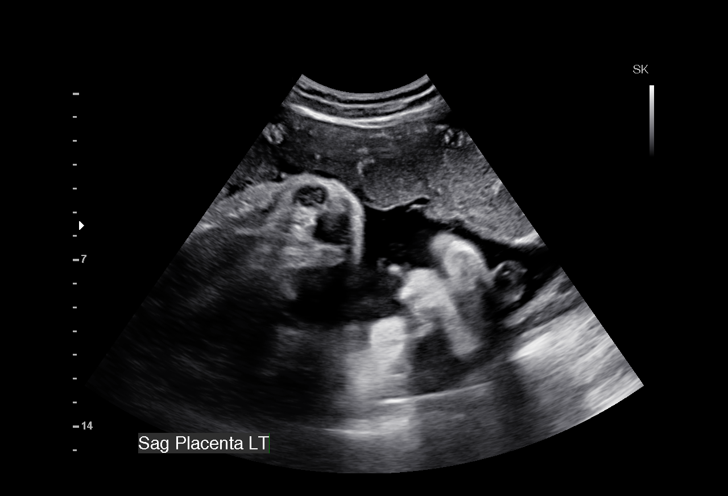
[im 12/100]
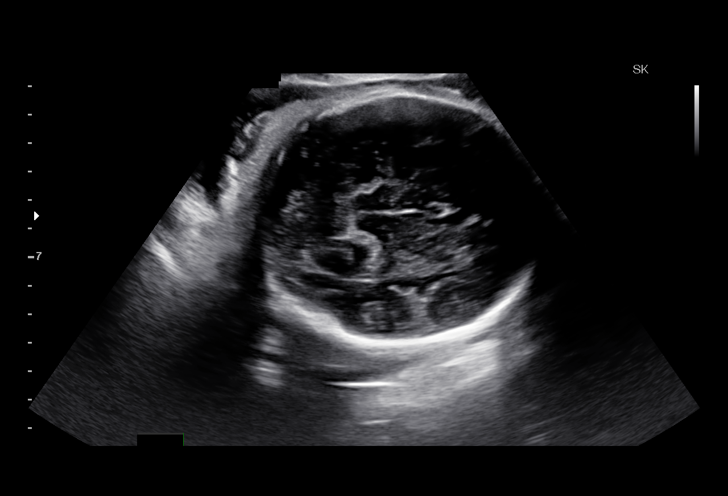
[im 19/100]
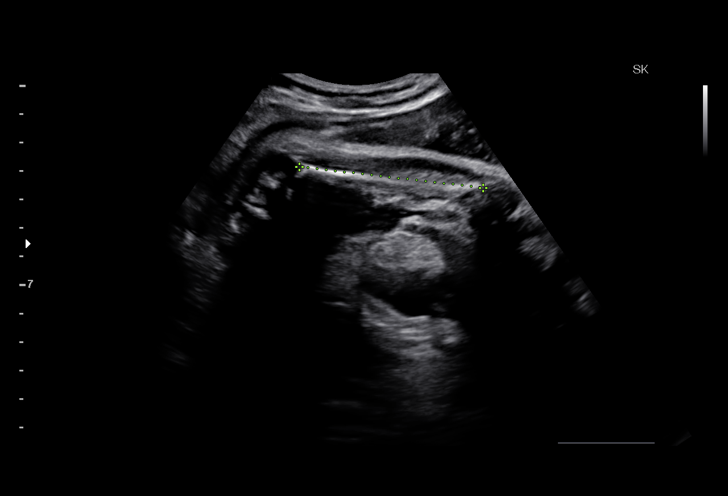
[im 26/100]
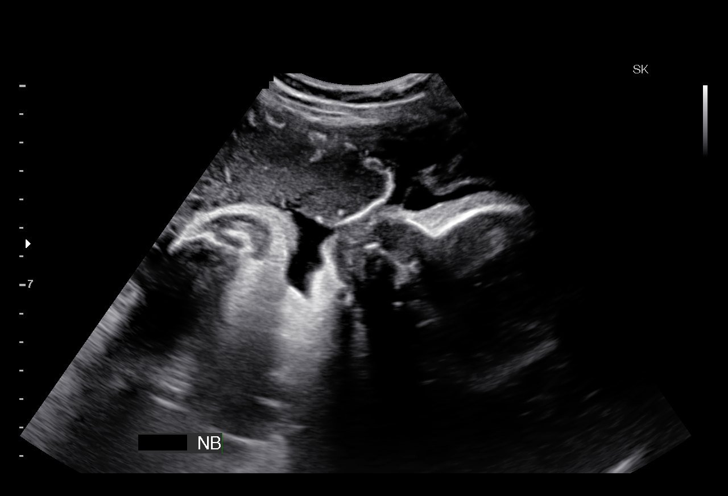
[im 34/100]
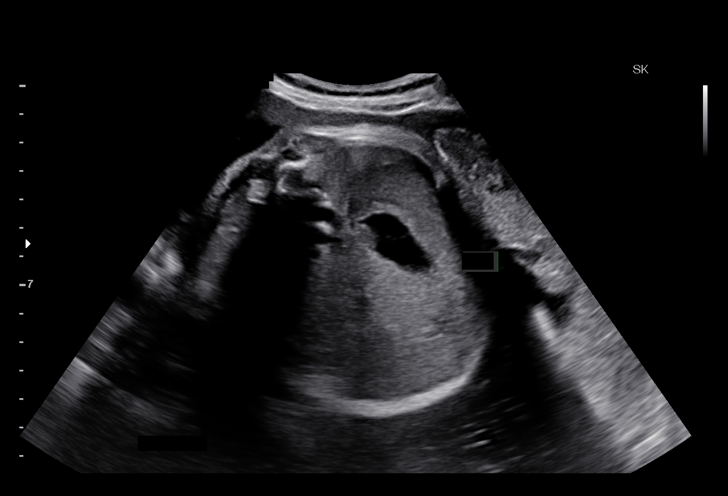
[im 41/100]
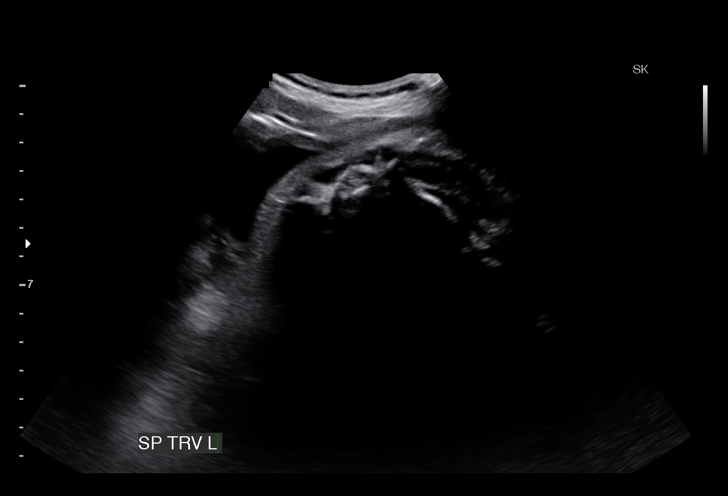
[im 48/100]
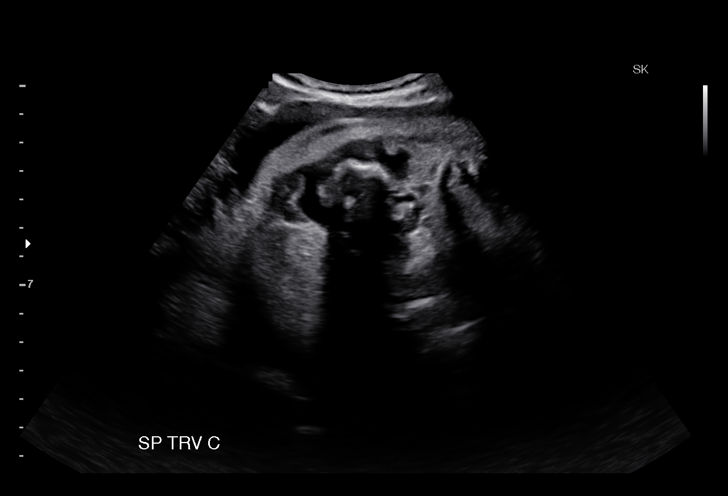
[im 56/100]
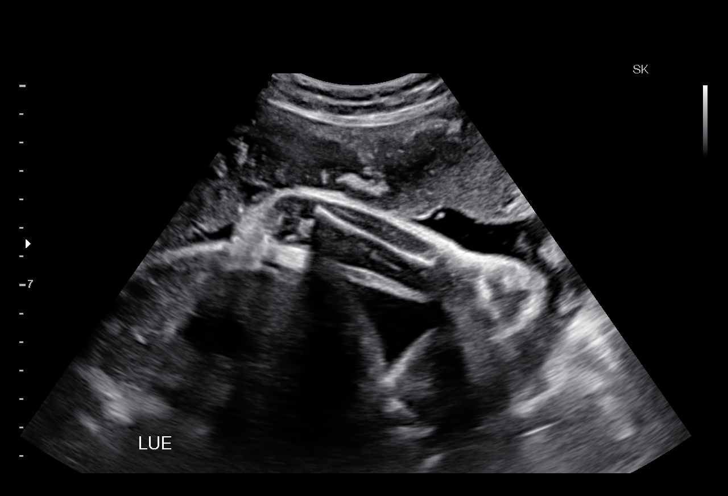
[im 63/100]
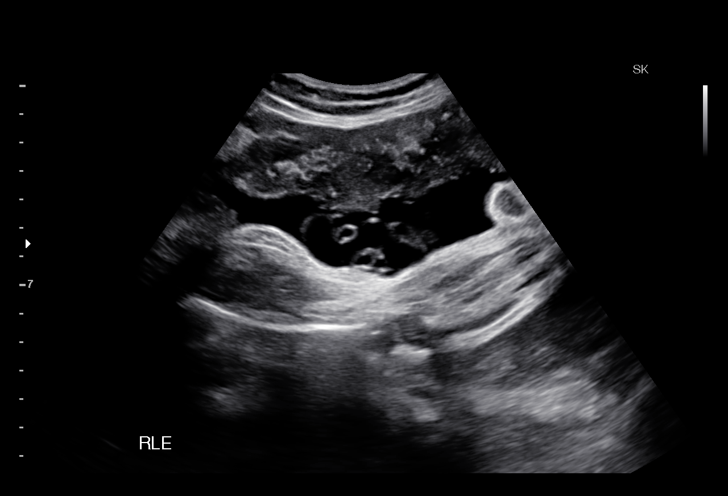
[im 70/100]
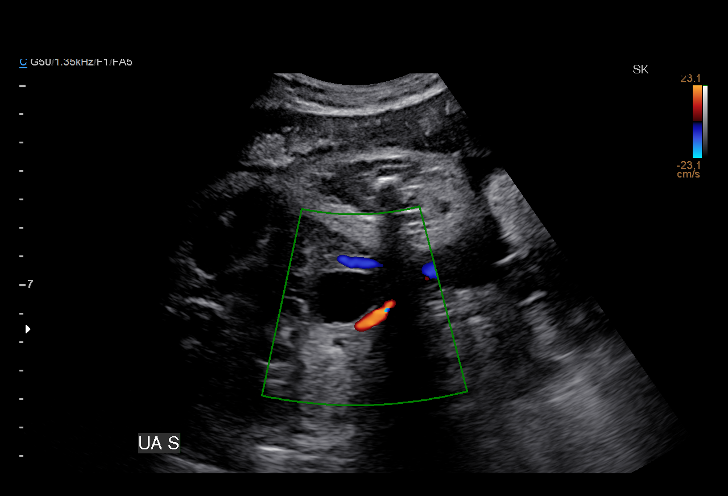
[im 78/100]
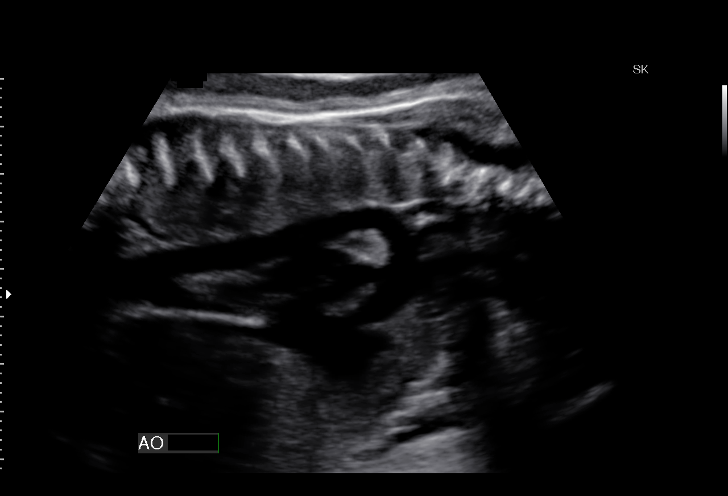
[im 85/100]
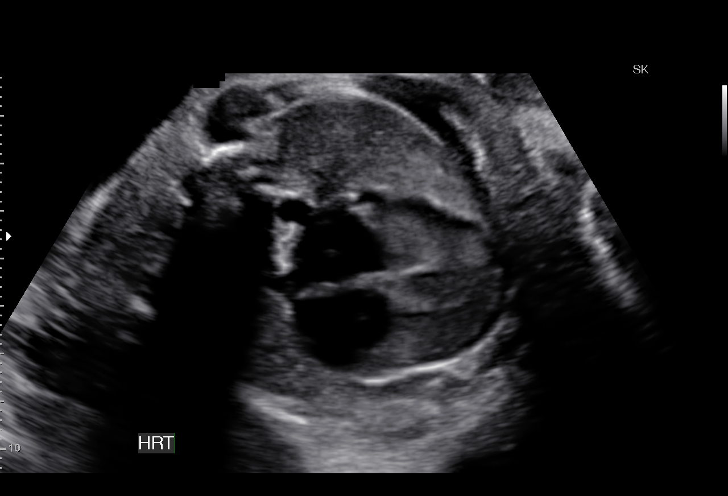
[im 92/100]
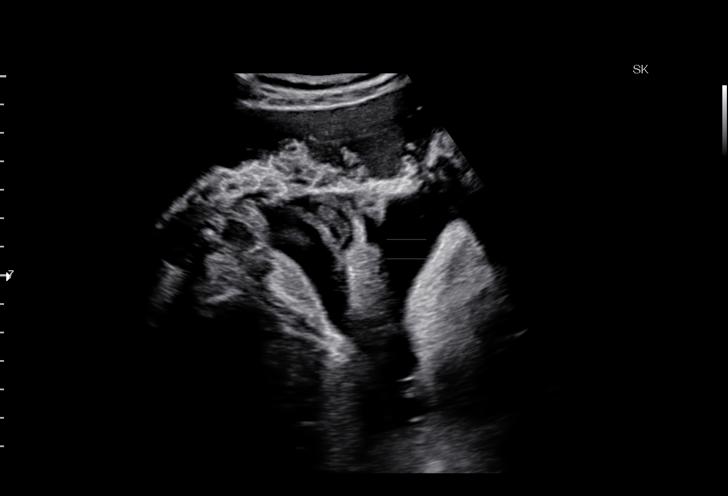
[im 100/100]
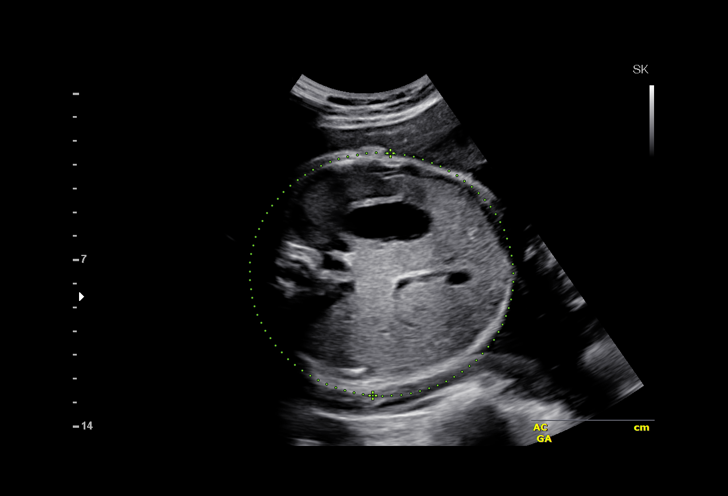

[14 of 28 positions shown; findings below may reference images not displayed]

OBSTETRICS REPORT
(Signed Final 09/10/2015 [DATE])

Name:       MTZ DELFINO                       Visit  09/10/2015 [DATE]
Date:

Service(s) Provided

Indications

Basic anatomic survey                                  Z36
38 weeks gestation of pregnancy
Fetal Evaluation

Num Of             1
Fetuses:
Fetal Heart        144                          bpm
Rate:
Cardiac Activity:  Observed
Presentation:      Cephalic
Placenta:          Anterior LEFT, above
cervical os
P. Cord            Not well visualized
Insertion:

Amniotic Fluid
AFI FV:      Subjectively within normal limits
AFI Sum:     11.23    cm      36  %Tile     Larg Pckt:    4.48   cm
RUQ:   1.49    cm    RLQ:   2.63    cm   LUQ:    4.48    cm   LLQ:    2.63   cm
Biometry

BPD:     89.1   m    G. Age:   36w 0d                 CI:         74.4   70 - 86
m
FL/HC:      20.1   20.9 -
22.7
HC:     327.9   m    G. Age:   37w 2d        13  %    HC/AC:      0.97   0.92 -
m
AC:     338.2   m    G. Age:   37w 5d        53  %    FL/BPD      74.0   71 - 87
m                                     :
FL:      65.9   m    G. Age:   34w 0d       < 3  %    FL/AC:      19.5   20 - 24
m
HUM:     56.7   m    G. Age:   33w 0d       < 5  %
m
Est.        3639   gm    6 lb 9 oz      41   %
FW:
Gestational Age

Clinical EDD:  38w 2d                                         EDD:   09/22/15
U/S Today:     36w 2d                                         EDD:   10/06/15
Best:          38w 2d    Det. By:   Clinical EDD              EDD:   09/22/15
Anatomy

Cranium:          Appears normal         Aortic Arch:       Appears normal
Fetal Cavum:      Appears normal         Ductal Arch:       Appears normal
Ventricles:       Appears normal         Diaphragm:         Appears normal
Choroid Plexus:   Appears normal         Stomach:           Appears normal,
left sided
Cerebellum:       Appears normal         Abdomen:           Appears normal
Posterior         Appears normal         Abdominal          Appears nml (cord
Fossa:                                   Wall:              insert, abd wall)
Nuchal Fold:      Not applicable (>20    Cord Vessels:      Appears normal (3
wks GA)                                   vessel cord)
Face:             Appears normal         Kidneys:           Appear normal
(orbits and profile)
Lips:             Appears normal         Bladder:           Appears normal
Heart:            Appears normal         Spine:             Appears normal
(4CH, axis, and
situs)
RVOT:             Appears normal         Lower              Appears normal
Extremities:
LVOT:             Appears normal         Upper              Appears normal -
Extremities:       RUE not well seen

Other:   Technically difficult due to advanced GA and fetal position. Female
gender. Nasal bone visualized.
Cervix Uterus Adnexa

Cervix:       Not visualized (advanced GA >62wks)

Left Ovary:    Not visualized.
Right Ovary:   Within normal limits.
Impression

Single IUP at 38w 2d
Late transfer of care
Normal anatomic survey; although somewhat limited due to
late gestational age
The estimated fetal weight today is at the 41st %tile.
The femurs and humeri measure < 5th %tile, but appear
normal morphologically.  Most likely constitutional
Left, anterior placenta without previa
Normal amniotic fluid volume
Recommendations

Follow-up ultrasounds as clinically indicated.

## 2016-08-20 ENCOUNTER — Other Ambulatory Visit (HOSPITAL_COMMUNITY)
Admission: RE | Admit: 2016-08-20 | Discharge: 2016-08-20 | Disposition: A | Discharge status: Home / Self Care | Payer: Medicaid Other | Source: Ambulatory Visit | Attending: Obstetrics and Gynecology | Admitting: Obstetrics and Gynecology

## 2016-08-20 ENCOUNTER — Encounter: Payer: Medicaid Other | Admitting: Obstetrics & Gynecology

## 2016-08-20 ENCOUNTER — Encounter: Payer: Self-pay | Admitting: Obstetrics and Gynecology

## 2016-08-20 ENCOUNTER — Ambulatory Visit (INDEPENDENT_AMBULATORY_CARE_PROVIDER_SITE_OTHER): Payer: Medicaid Other | Admitting: Obstetrics and Gynecology

## 2016-08-20 VITALS — BP 98/55 | HR 96 | Wt 103.5 lb

## 2016-08-20 DIAGNOSIS — Z1151 Encounter for screening for human papillomavirus (HPV): Secondary | ICD-10-CM

## 2016-08-20 DIAGNOSIS — Z3493 Encounter for supervision of normal pregnancy, unspecified, third trimester: Secondary | ICD-10-CM | POA: Insufficient documentation

## 2016-08-20 DIAGNOSIS — Z01419 Encounter for gynecological examination (general) (routine) without abnormal findings: Secondary | ICD-10-CM | POA: Insufficient documentation

## 2016-08-20 DIAGNOSIS — Z124 Encounter for screening for malignant neoplasm of cervix: Secondary | ICD-10-CM

## 2016-08-20 DIAGNOSIS — Z113 Encounter for screening for infections with a predominantly sexual mode of transmission: Secondary | ICD-10-CM | POA: Insufficient documentation

## 2016-08-20 DIAGNOSIS — Z3483 Encounter for supervision of other normal pregnancy, third trimester: Secondary | ICD-10-CM

## 2016-08-20 DIAGNOSIS — Z23 Encounter for immunization: Secondary | ICD-10-CM

## 2016-08-20 DIAGNOSIS — O09899 Supervision of other high risk pregnancies, unspecified trimester: Secondary | ICD-10-CM | POA: Insufficient documentation

## 2016-08-20 MED ORDER — TETANUS-DIPHTH-ACELL PERTUSSIS 5-2.5-18.5 LF-MCG/0.5 IM SUSP
0.5000 mL | Freq: Once | INTRAMUSCULAR | Status: AC
  Administered 2016-08-20: 0.5 mL via INTRAMUSCULAR

## 2016-08-20 NOTE — Progress Notes (Signed)
  Subjective:    Brianna Bishop is being seen today for her first obstetrical visit.  This is not a planned pregnancy. She is at 6464w0d gestation. Her obstetrical history is significant for short interval between pregnancy. Patient had a normal SVD in 09/2015. Relationship with FOB: currently in [place]. Patient does intend to breast feed. Pregnancy history fully reviewed.  Patient reports no complaints.  Review of Systems:   Review of Systems See pertinent in HPI Objective:     BP (!) 98/55   Pulse 96   Wt 103 lb 8 oz (46.9 kg)   BMI 21.48 kg/m  Physical Exam  Exam GENERAL: Well-developed, well-nourished female in no acute distress.  HEENT: Normocephalic, atraumatic. Sclerae anicteric.  NECK: Supple. Normal thyroid.  LUNGS: Clear to auscultation bilaterally.  HEART: Regular rate and rhythm. BREASTS: Symmetric in size. No palpable masses or lymphadenopathy, skin changes, or nipple drainage. ABDOMEN: Soft, nontender, gravid PELVIC: Normal external female genitalia. Vagina is pink and rugated.  Normal discharge. Normal appearing cervix. Uterus is normal in size.  No adnexal mass or tenderness. EXTREMITIES: No cyanosis, clubbing, or edema, 2+ distal pulses.    Assessment:    Pregnancy: G2P1001 Patient Active Problem List   Diagnosis Date Noted  . Supervision of normal pregnancy in third trimester 08/20/2016  . Short interval between pregnancies affecting pregnancy, antepartum 08/20/2016       Plan:     Initial labs drawn. Prenatal vitamins. Problem list reviewed and updated. AFP3 discussed: too late. 1 hr glucola today with labs Flu and TDap vaccine today Discussed discrepancy in dating. Patient states her EDD is 10/16/16. Early ultrasound at 9 weeks establishes EDD 10/08/16. Patient is uncertain on her LMP. Explained that dating is established based on earliest ultrasound Role of ultrasound in pregnancy discussed; fetal survey: ordered. Follow up in 2 weeks. 50% of  30 min visit spent on counseling and coordination of care.     Eldine Rencher 08/20/2016

## 2016-08-21 ENCOUNTER — Other Ambulatory Visit: Payer: Self-pay | Admitting: Obstetrics and Gynecology

## 2016-08-21 LAB — PRENATAL PROFILE (SOLSTAS)
Antibody Screen: NEGATIVE
Basophils Absolute: 0 cells/uL (ref 0–200)
Basophils Relative: 0 %
EOS PCT: 1 %
Eosinophils Absolute: 76 cells/uL (ref 15–500)
HEMATOCRIT: 27.9 % — AB (ref 35.0–45.0)
HEP B S AG: NEGATIVE
HIV 1&2 Ab, 4th Generation: NONREACTIVE
Hemoglobin: 8.7 g/dL — ABNORMAL LOW (ref 11.7–15.5)
LYMPHS ABS: 1596 {cells}/uL (ref 850–3900)
Lymphocytes Relative: 21 %
MCH: 22.6 pg — ABNORMAL LOW (ref 27.0–33.0)
MCHC: 31.2 g/dL — ABNORMAL LOW (ref 32.0–36.0)
MCV: 72.5 fL — ABNORMAL LOW (ref 80.0–100.0)
MPV: 8.9 fL (ref 7.5–12.5)
Monocytes Absolute: 532 cells/uL (ref 200–950)
Monocytes Relative: 7 %
NEUTROS ABS: 5396 {cells}/uL (ref 1500–7800)
NEUTROS PCT: 71 %
PLATELETS: 197 10*3/uL (ref 140–400)
RBC: 3.85 MIL/uL (ref 3.80–5.10)
RDW: 17.2 % — AB (ref 11.0–15.0)
RH TYPE: POSITIVE
Rubella: 6.01 Index — ABNORMAL HIGH (ref <0.90)
WBC: 7.6 10*3/uL (ref 3.8–10.8)

## 2016-08-21 LAB — CULTURE, OB URINE: ORGANISM ID, BACTERIA: NO GROWTH

## 2016-08-21 LAB — GC/CHLAMYDIA PROBE AMP (~~LOC~~) NOT AT ARMC
CHLAMYDIA, DNA PROBE: NEGATIVE
Neisseria Gonorrhea: NEGATIVE

## 2016-08-21 LAB — GLUCOSE TOLERANCE, 1 HOUR (50G) W/O FASTING: GLUCOSE, 1 HR, GESTATIONAL: 104 mg/dL (ref <140)

## 2016-08-21 MED ORDER — PRENATAL VITAMINS 0.8 MG PO TABS: 1.0000 | ORAL_TABLET | Freq: Every day | ORAL | 12 refills | Status: DC

## 2016-08-21 MED ORDER — FERROUS SULFATE 325 (65 FE) MG PO TABS: 325.0000 mg | ORAL_TABLET | Freq: Two times a day (BID) | ORAL | 1 refills | Status: DC

## 2016-08-22 ENCOUNTER — Telehealth: Payer: Self-pay | Admitting: General Practice

## 2016-08-22 DIAGNOSIS — O99013 Anemia complicating pregnancy, third trimester: Secondary | ICD-10-CM

## 2016-08-22 LAB — HEMOGLOBINOPATHY EVALUATION
HCT: 27.9 % — ABNORMAL LOW (ref 35.0–45.0)
HEMOGLOBIN: 8.7 g/dL — AB (ref 11.7–15.5)
HGB A2 QUANT: 2.5 % (ref 1.8–3.5)
Hgb A: 96.5 % (ref >96.0)
Hgb F Quant: <1 % (ref <2.0)
MCH: 22.6 pg — ABNORMAL LOW (ref 27.0–33.0)
MCV: 72.5 fL — ABNORMAL LOW (ref 80.0–100.0)
RBC: 3.85 MIL/uL (ref 3.80–5.10)
RDW: 17.2 % — AB (ref 11.0–15.0)

## 2016-08-22 LAB — CYTOLOGY - PAP

## 2016-08-22 MED ORDER — FERROUS SULFATE 325 (65 FE) MG PO TABS: 325.0000 mg | ORAL_TABLET | Freq: Two times a day (BID) | ORAL | 1 refills | Status: DC

## 2016-08-22 MED ORDER — PRENATAL VITAMINS 0.8 MG PO TABS: 1.0000 | ORAL_TABLET | Freq: Every day | ORAL | 12 refills | Status: DC

## 2016-08-22 NOTE — Telephone Encounter (Signed)
Per Dr Jolayne Pantheronstant, patient is anemic and iron & PNV have been sent to her pharmacy. Called patient with pacific interpreter [phone] and informed her of results & medications. Patient verbalized understanding and asked if they could be sent to [facility] instead. Told patient we will take care of that for her. Patient verbalized understanding & had no questions

## 2016-08-25 ENCOUNTER — Ambulatory Visit (HOSPITAL_COMMUNITY)
Admission: RE | Admit: 2016-08-25 | Discharge: 2016-08-25 | Disposition: A | Discharge status: Home / Self Care | Payer: Medicaid Other | Source: Ambulatory Visit | Attending: Obstetrics and Gynecology | Admitting: Obstetrics and Gynecology

## 2016-08-25 DIAGNOSIS — Z3A33 33 weeks gestation of pregnancy: Secondary | ICD-10-CM | POA: Insufficient documentation

## 2016-08-25 DIAGNOSIS — Z363 Encounter for antenatal screening for malformations: Secondary | ICD-10-CM | POA: Diagnosis not present

## 2016-08-25 DIAGNOSIS — O0933 Supervision of pregnancy with insufficient antenatal care, third trimester: Secondary | ICD-10-CM | POA: Diagnosis not present

## 2016-08-25 DIAGNOSIS — O09893 Supervision of other high risk pregnancies, third trimester: Secondary | ICD-10-CM | POA: Diagnosis not present

## 2016-08-25 DIAGNOSIS — Z3483 Encounter for supervision of other normal pregnancy, third trimester: Secondary | ICD-10-CM

## 2016-08-26 ENCOUNTER — Other Ambulatory Visit (HOSPITAL_COMMUNITY): Payer: Self-pay | Admitting: Maternal and Fetal Medicine

## 2016-08-26 DIAGNOSIS — Z3A33 33 weeks gestation of pregnancy: Secondary | ICD-10-CM

## 2016-08-26 DIAGNOSIS — O0933 Supervision of pregnancy with insufficient antenatal care, third trimester: Secondary | ICD-10-CM

## 2016-08-26 DIAGNOSIS — O09893 Supervision of other high risk pregnancies, third trimester: Secondary | ICD-10-CM

## 2016-08-27 ENCOUNTER — Other Ambulatory Visit (HOSPITAL_COMMUNITY): Payer: Self-pay | Admitting: *Deleted

## 2016-08-27 ENCOUNTER — Ambulatory Visit (HOSPITAL_COMMUNITY)
Admission: RE | Admit: 2016-08-27 | Discharge: 2016-08-27 | Disposition: A | Discharge status: Home / Self Care | Payer: Medicaid Other | Source: Ambulatory Visit | Attending: Obstetrics and Gynecology | Admitting: Obstetrics and Gynecology

## 2016-08-27 ENCOUNTER — Encounter (HOSPITAL_COMMUNITY): Payer: Self-pay

## 2016-08-27 DIAGNOSIS — O09893 Supervision of other high risk pregnancies, third trimester: Secondary | ICD-10-CM | POA: Insufficient documentation

## 2016-08-27 DIAGNOSIS — Z3A34 34 weeks gestation of pregnancy: Secondary | ICD-10-CM | POA: Insufficient documentation

## 2016-08-27 DIAGNOSIS — O365930 Maternal care for other known or suspected poor fetal growth, third trimester, not applicable or unspecified: Secondary | ICD-10-CM | POA: Insufficient documentation

## 2016-08-27 DIAGNOSIS — O0933 Supervision of pregnancy with insufficient antenatal care, third trimester: Secondary | ICD-10-CM

## 2016-08-27 DIAGNOSIS — Z3A33 33 weeks gestation of pregnancy: Secondary | ICD-10-CM

## 2016-09-11 ENCOUNTER — Ambulatory Visit (INDEPENDENT_AMBULATORY_CARE_PROVIDER_SITE_OTHER): Payer: Medicaid Other | Admitting: Obstetrics & Gynecology

## 2016-09-11 ENCOUNTER — Other Ambulatory Visit (HOSPITAL_COMMUNITY)
Admission: RE | Admit: 2016-09-11 | Discharge: 2016-09-11 | Disposition: A | Discharge status: Home / Self Care | Payer: Medicaid Other | Source: Ambulatory Visit | Attending: Obstetrics & Gynecology | Admitting: Obstetrics & Gynecology

## 2016-09-11 VITALS — BP 101/52 | HR 78 | Wt 106.1 lb

## 2016-09-11 DIAGNOSIS — Z113 Encounter for screening for infections with a predominantly sexual mode of transmission: Secondary | ICD-10-CM | POA: Diagnosis present

## 2016-09-11 DIAGNOSIS — Z3483 Encounter for supervision of other normal pregnancy, third trimester: Secondary | ICD-10-CM

## 2016-09-11 LAB — OB RESULTS CONSOLE GBS: GBS: NEGATIVE

## 2016-09-11 LAB — OB RESULTS CONSOLE GC/CHLAMYDIA: GC PROBE AMP, GENITAL: NEGATIVE

## 2016-09-11 NOTE — Progress Notes (Signed)
   PRENATAL VISIT NOTE  Subjective:  Brianna Bishop is a 20 y.o. G2P1001 at 611w1d being seen today for ongoing prenatal care.  She is currently monitored for the following issues for this low-risk pregnancy and has Supervision of normal pregnancy in third trimester and Short interval between pregnancies affecting pregnancy, antepartum on her problem list.  Patient reports llow back, leg painc/w sciatica.  Contractions: Not present. Vag. Bleeding: None.  Movement: Present. Denies leaking of fluid.   The following portions of the patient's history were reviewed and updated as appropriate: allergies, current medications, past family history, past medical history, past social history, past surgical history and problem list. Problem list updated.  Objective:   Vitals:   09/11/16 1415  BP: (!) 101/52  Pulse: 78  Weight: 106 lb 1.6 oz (48.1 kg)    Fetal Status: Fetal Heart Rate (bpm): 142 Fundal Height: 35 cm Movement: Present     General:  Alert, oriented and cooperative. Patient is in no acute distress.  Skin: Skin is warm and dry. No rash noted.   Cardiovascular: Normal heart rate noted  Respiratory: Normal respiratory effort, no problems with respiration noted  Abdomen: Soft, gravid, appropriate for gestational age. Pain/Pressure: Present     Pelvic:  Cervical exam performed        Extremities: Normal range of motion.  Edema: None  Mental Status: Normal mood and affect. Normal behavior. Normal judgment and thought content.   Assessment and Plan:  Pregnancy: G2P1001 at 241w1d  1. Encounter for supervision of other normal pregnancy in third trimester Sciatic pain, instructions given - GC/Chlamydia probe amp ([place] - Culture, beta strep (group b only)  Preterm labor symptoms and general obstetric precautions including but not limited to vaginal bleeding, contractions, leaking of fluid and fetal movement were reviewed in detail with the patient. Please refer to After  Visit Summary for other counseling recommendations.  Return in about 1 week (around 09/18/2016).  Adam PhenixJames G Haeven Nickle, MD

## 2016-09-11 NOTE — Progress Notes (Signed)
Used video interpreter Silvestre MesiCristina [phone]. C/o pain in hips like sciatic nerve, sometimes hard to walk.

## 2016-09-11 NOTE — Patient Instructions (Signed)
Dolor Radicular (Radicular Pain) El dolor radicular est causado en la irritacin de las races Baileynerviosas. Generalmente la causa es una degeneracin el un disco de la columna vertebral. Esto produce dolor que se siente en el brazo o la pierna. Entre otras causas del dolor radicular se incluyen:  Nurse, children'sracturas  Enfermedades cardacas  Cncer  Neuropatas (un estado anormal y a menudo degenerativo del sistema nervioso o nervios). Para el diagnstico le indicarn una tomografa computarizada o imgenes por resonancia magntica para determinar la causa primaria. Los nervios que comienzan en el cuello (races nerviosas) pueden Doctor, general practiceocasionar dolor radicular en la parte exterior del hombro y en el brazo. ste puede extenderse hacia el pulgar y los dedos. Los sntomas varan segn la raz nerviosa afectada. En la mayor parte de los casos mejora en gran medida con un tratamiento conservador. Para los problemas en el cuello le indicarn fisioterapia, un collar o una traccin cervical. El tratamiento puede durar varias semanas y se considerar la posibilidad de una ciruga si los sntomas no mejoran.  El tratamiento conservador tambin se recomienda para los casos de citica que causan dolor que se irradia desde la zona baja de la espalda o nalgas hacia la pierna o el pie. Generalmente hay una historia previa de problemas en la espalda. La mayora de los pacientes mejora completamente luego de 2 a 4 semanas de reposo en cama y otros tratamientos de apoyo. El reposo en cama reduce la presin en el disco considerablemente. El estar sentado no es una buena posicin, ya que aumenta la presin sobre el disco. Evite encorvarse, levantar mucho peso, permanecer sentado por Con-waymucho tiempo, y las actividades que puedan empeorar el problema. El los casos ms graves se Magazine features editorrealizar una traccin. La ciruga est reservada para los [address] Msbpacientes que no mejoran dentro de los primeros meses de Richardstratamiento. Utilice los medicamentos de venta libre  o de prescripcin para Chief Technology Officerel dolor, Environmental health practitionerel malestar o la Davistonfiebre, segn se lo indique el profesional que lo asiste. Los narcticos y los relajantes musculares ayudan a Teacher, early years/precalmar el dolor ms intenso y los espasmos, proporcionando una sedacin [place] B[place]. Tressie StalkerUna terapia de fro o Gutierrezburymasajes tambin le brindarn Hominyalivio. No se recomienda la manipulacin de la columna vertebral. Esto puede aumentar el grado de protrusin del disco. Las inyecciones epidurales con esteroides son a menudo un tratamiento efectivo para el dolor radicular. Esas inyecciones colocan medicamentos al nervio espinal en el espacio entre la cubierta protectora de la mdula espinal y los huesos de la espalda (vertebrae). El profesional que lo asiste podr darle ms informacin acerca de las inyecciones de esteroides. Estas inyecciones son ms efectivas cuando se administran dentro de las Marsh & McLennandos semanas de la aparicin del Engineer, miningdolor.  Comunquese con su mdico para realizar un control segn las indicaciones. Como parte importante del tratamiento deber seguir un programa de rehabilitacin, con ejercicios de elongacin y fortalecimiento.  SOLICITE ATENCIN MDICA DE INMEDIATO SI:  Presenta siente debilidad o adormecimiento inusual en sus brazos o piernas.  Presenta prdida del control del intestino o de la vejiga.  Presenta dolor abdominal.   Esta informacin no tiene como fin reemplazar el consejo del mdico. Asegrese de hacerle al mdico cualquier pregunta que tenga.   Document Released: 10/27/2005 Document Revised: 01/19/2012 Elsevier Interactive Patient Education [employer]2016 Elsevier Inc.

## 2016-09-12 LAB — GC/CHLAMYDIA PROBE AMP (~~LOC~~) NOT AT ARMC
Chlamydia: NEGATIVE
Neisseria Gonorrhea: NEGATIVE

## 2016-09-14 LAB — CULTURE, BETA STREP (GROUP B ONLY)

## 2016-09-22 ENCOUNTER — Ambulatory Visit (HOSPITAL_COMMUNITY)
Admission: RE | Admit: 2016-09-22 | Discharge: 2016-09-22 | Disposition: A | Discharge status: Home / Self Care | Payer: Medicaid Other | Source: Ambulatory Visit | Attending: Obstetrics and Gynecology | Admitting: Obstetrics and Gynecology

## 2016-09-22 ENCOUNTER — Encounter (HOSPITAL_COMMUNITY): Payer: Self-pay

## 2016-09-22 ENCOUNTER — Other Ambulatory Visit (HOSPITAL_COMMUNITY): Payer: Self-pay | Admitting: Maternal and Fetal Medicine

## 2016-09-22 DIAGNOSIS — O365930 Maternal care for other known or suspected poor fetal growth, third trimester, not applicable or unspecified: Secondary | ICD-10-CM | POA: Insufficient documentation

## 2016-09-22 DIAGNOSIS — Z364 Encounter for antenatal screening for fetal growth retardation: Secondary | ICD-10-CM | POA: Insufficient documentation

## 2016-09-22 DIAGNOSIS — Z3A37 37 weeks gestation of pregnancy: Secondary | ICD-10-CM | POA: Diagnosis not present

## 2016-09-25 ENCOUNTER — Ambulatory Visit (INDEPENDENT_AMBULATORY_CARE_PROVIDER_SITE_OTHER): Payer: Medicaid Other | Admitting: Obstetrics and Gynecology

## 2016-09-25 DIAGNOSIS — O26849 Uterine size-date discrepancy, unspecified trimester: Secondary | ICD-10-CM | POA: Insufficient documentation

## 2016-09-25 DIAGNOSIS — O26843 Uterine size-date discrepancy, third trimester: Secondary | ICD-10-CM

## 2016-09-25 NOTE — Progress Notes (Signed)
Spanish video interpreter "Viviana" [phone] used

## 2016-09-25 NOTE — Progress Notes (Signed)
Prenatal Visit Note Date: 09/25/2016 Clinic: [facility]  Subjective:  Brianna Bishop is a 20 y.o. G2P1001 at 9359w1d being seen today for ongoing prenatal care.  She is currently monitored for the following issues for this low-risk pregnancy and has Supervision of normal pregnancy in third trimester and Short interval between pregnancies affecting pregnancy, antepartum on her problem list.  Patient reports musculoskeletal joint/hip pain.   Contractions: Not present. Vag. Bleeding: None.  Movement: Present. Denies leaking of fluid.   The following portions of the patient's history were reviewed and updated as appropriate: allergies, current medications, past family history, past medical history, past social history, past surgical history and problem list. Problem list updated.  Objective:   Vitals:   09/25/16 0856  BP: (!) 98/58  Pulse: 83  Weight: 109 lb 4.8 oz (49.6 kg)    Fetal Status: Fetal Heart Rate (bpm): 143 Fundal Height: 35 cm Movement: Present  Presentation: Vertex  General:  Alert, oriented and cooperative. Patient is in no acute distress.  Skin: Skin is warm and dry. No rash noted.   Cardiovascular: Normal heart rate noted  Respiratory: Normal respiratory effort, no problems with respiration noted  Abdomen: Soft, gravid, appropriate for gestational age. Pain/Pressure: Present     Pelvic:  Cervical exam deferred        Extremities: Normal range of motion.  Edema: None  Mental Status: Normal mood and affect. Normal behavior. Normal judgment and thought content.   Urinalysis:      Assessment and Plan:  Pregnancy: G2P1001 at 6559w1d Routine care. Normal ac/afi/efw on 11/13. Dad is small too mom says so likely consitutional  Term labor symptoms and general obstetric precautions including but not limited to vaginal bleeding, contractions, leaking of fluid and fetal movement were reviewed in detail with the patient. Please refer to After Visit Summary  for other counseling recommendations.  Return in about 1 week (around 10/02/2016).   [place] Bingharlie Dawn Kiper, MD

## 2016-10-01 ENCOUNTER — Ambulatory Visit (INDEPENDENT_AMBULATORY_CARE_PROVIDER_SITE_OTHER): Payer: Medicaid Other | Admitting: Obstetrics & Gynecology

## 2016-10-01 ENCOUNTER — Encounter: Payer: Self-pay | Admitting: Obstetrics & Gynecology

## 2016-10-01 VITALS — BP 104/65 | HR 91 | Wt 109.6 lb

## 2016-10-01 DIAGNOSIS — Z3483 Encounter for supervision of other normal pregnancy, third trimester: Secondary | ICD-10-CM

## 2016-10-01 NOTE — Progress Notes (Signed)
Spanish video interpreter "Onalee HuaDavid" [phone] used for visit

## 2016-10-01 NOTE — Patient Instructions (Signed)
Parto vaginal (Vaginal Delivery) Durante el parto, el mdico la ayudar a dar a luz a su beb. En elparto vaginal, deber pujar para que el beb salga por la vagina. Sin embargo, antes de que pueda sacar al beb, es necesario que ocurran ciertas cosas. La abertura del tero (cuello del tero) tiene que ablandarse, hacerse ms Dicenzo y abrirse (dilatar) hasta que llegue a 10 cm. Adems, el beb tiene que bajar desde el tero a la vagina. SIGNOS DE TRABAJO DE PARTO  El mdico tendr primero que asegurarse de que usted est en trabajo de parto. Algunos signos son:   Eliminar lo que se llama tapn mucoso antes del inicio del trabajo de parto. Este es una pequea cantidad de mucosidad teida con sangre.  Tener contracciones uterinas regulares y dolorosas.   El tiempo entre las contracciones debe acortarse  Las molestias y el dolor se harn ms intensos gradualmente.  El dolor de las contracciones empeora al caminar y no se alivia con el reposo.   El cuello del tero se hace mas Bahl (se borra) y se dilata. ANTES DEL PARTO Una vez que se inicie el trabajo de parto y sea admitida en el hospital o sanatorio, el mdico podr hacer lo siguiente:   Realizar un examen fsico.  Controlar si hay complicaciones relacionadas con el trabajo de parto.  Verificar su presin arterial, temperatura y pulso y la frecuencia cardaca (signos vitales).   Determinar si se ha roto el saco amnitico y cundo ha ocurrido.  Realizar un examen vaginal (utilizando un guante estril y un lubricante) para determinar: ? La posicin (presentacin) del beb. El beb se presenta con la cabeza primero (vertex) en el canal de parto (vagina), o estn los pies o las nalgas primero (de nalgas)? ? El nivel (estacin) de la cabeza del beb dentro del canal de parto. ? El borramiento y la dilatacin del cuello uterino  El monitor fetal electrnico generalmente se coloca sobre el abdomen al llegar. Se utiliza para  controlar las contracciones y la frecuencia cardaca del beb. ? Cuando el monitor est en el abdomen (monitor fetal externo), slo toma la frecuencia y la duracin de las contracciones. No informa acerca de la intensidad de las contracciones. ? Si el mdico necesita saber exactamente la intensidad de las contracciones o cul es la frecuencia cardaca del beb, colocar un monitor interno en la vagina y el tero. El mdico comentar los riesgos y los beneficios de usar un monitor interno y le pedir autorizacin antes de colocar el dispositivo. ? El monitoreo fetal continuo ser necesario si le han aplicado una epidural, si le administran ciertos medicamentos (como oxitocina) y si tiene complicaciones del embarazo o del trabajo de parto.  Podrn colocarle una va intravenosa en una vena del brazo para suministrarle lquidos y medicamentos, si es necesario. TRES ETAPAS DEL TRABAJO DE PARTO Y EL PARTO El trabajo de parto y el parto normales se dividen en tres etapas. Primera etapa Esta etapa comienza cuando comienzan las contracciones regulares y el cuello comienza a borrarse y dilatarse. Finaliza cuando el cuello est completamente abierto (completamente dilatado). La primera etapa es la etapa ms larga del trabajo de parto y puede durar desde 3 horas a 15 horas.  Algunos mtodos estn disponibles para ayudar con el dolor del parto. Usted y su mdico decidirn qu opcin es la mejor para usted. Las opciones incluyen:   Medicamentos narcticos. Estos son medicamentos fuertes que usted puede recibir a travs de una va intravenosa o   como inyeccin en el msculo. Estos medicamentos alivian el dolor pero no hacen que desaparezca completamente.  Epidural. Se administra un medicamento a travs de un tubo [place] que se inserta en la espalda. El medicamento adormece la parte inferior del cuerpo y evita el dolor en esa zona.  Bloqueo paracervical Es una inyeccin de un anestsico en cada lado del cuello  uterino.  Usted podr pedir un parto natural, que implica que no se usen analgsicos ni epidural durante el parto y el trabajo de parto. En cambio, podr tener otro tipo de ayuda como ejercicios respiratorios para hacer frente al dolor. Segunda etapa La segunda etapa del trabajo de parto comienza cuando el cuello se ha dilatado completamente a 10 cm. Contina hasta que usted puja al beb hacia abajo, por el canal de parto, y el beb nace. Esta etapa puede durar slo algunos minutos o algunas horas.  La posicin del la cabeza del beb a medida que pasa por el canal de parto, es informada como un nmero, llamado estacin. Si la cabeza del beb no ha iniciado su descenso, la estacin se describe como que est en menos 3 (-3). Cuando la cabeza del beb est en la estacin cero, est en el medio del canal de parto y se encaja en la pelvis. La estacin en la que se encuentra el beb indica el progreso de la segunda etapa del trabajo de parto.  Cuando el beb nace, el mdico lo sostendr con la cabeza hacia abajo para evitar que el lquido amnitico, el moco y la sangre entren en los pulmones del beb. La boca y la nariz del beb podrn ser succionadas con un pequeo bulbo para retirar todo lquido adicional.  El mdico podr colocar al beb sobre su estmago. Es importante evitar que el beb tome fro. Para hacerlo, el mdico secar al beb, lo colocar directamente sobre su piel, (sin mantas entre usted y el beb) y lo cubrir con mantas secas y tibias.  Se corta el cordn umbilical. Tercera etapa Durante la tercera etapa del trabajo de parto, el mdico sacar la placenta (alumbramiento) y se asegurar de que el sangrado est controlado. La salida de la placenta generalmente demora 5 minutos pero puede tardar hasta 30 minutos. Luego de la salida de la placenta, le darn un medicamento por va intravenosa o inyectable para ayudar a contraer el tero y controlar el sangrado. Si planea amamantar al beb,  puede intentar en este momento Luego de la salida de la placenta, el tero debe contraerse y quedar muy firme. Si el tero no queda firme, el mdico lo masajear. Esto es importante debido a que la contraccin del tero ayuda a cortar el sangrado en el sitio en que la placenta estaba unida al tero. Si el tero no se contrae adecuadamente ni permanece firme, podr causar un sangrado abundante. Si hay mucho sangrado, podrn darle medicamentos para contraer el tero y detener el sangrado.  Esta informacin no tiene como fin reemplazar el consejo del mdico. Asegrese de hacerle al mdico cualquier pregunta que tenga. Document Released: 10/09/2008 Document Revised: 11/17/2014 Elsevier Interactive Patient Education  2017 Elsevier Inc.  

## 2016-10-01 NOTE — Progress Notes (Signed)
   PRENATAL VISIT NOTE  Subjective:  Brianna AntiguaMariana Bishop is a 20 y.o. G2P1001 at 6932w0d being seen today for ongoing prenatal care.  She is currently monitored for the following issues for this low-risk pregnancy and has Supervision of normal pregnancy in third trimester; Short interval between pregnancies affecting pregnancy, antepartum; and Fundal height low for dates on her problem list.  Patient reports occasional contractions.  Contractions: Irregular. Vag. Bleeding: None.  Movement: Present. Denies leaking of fluid.   The following portions of the patient's history were reviewed and updated as appropriate: allergies, current medications, past family history, past medical history, past social history, past surgical history and problem list. Problem list updated.  Objective:   Vitals:   10/01/16 1300  BP: 104/65  Pulse: 91  Weight: 109 lb 9.6 oz (49.7 kg)    Fetal Status:     Movement: Present     General:  Alert, oriented and cooperative. Patient is in no acute distress.  Skin: Skin is warm and dry. No rash noted.   Cardiovascular: Normal heart rate noted  Respiratory: Normal respiratory effort, no problems with respiration noted  Abdomen: Soft, gravid, appropriate for gestational age. Pain/Pressure: Present     Pelvic:  Cervical exam performed        Extremities: Normal range of motion.  Edema: None  Mental Status: Normal mood and affect. Normal behavior. Normal judgment and thought content.   Assessment and Plan:  Pregnancy: G2P1001 at 1532w0d  1. Encounter for supervision of other normal pregnancy in third trimester Normal growth by US   Term labor symptoms and general obstetric precautions including but not limited to vaginal bleeding, contractions, leaking of fluid and fetal movement were reviewed in detail with the patient. Please refer to After Visit Summary for other counseling recommendations.  Return in about 1 week (around 10/08/2016).   Adam PhenixJames G Zurie Platas, MD

## 2016-10-08 ENCOUNTER — Ambulatory Visit (INDEPENDENT_AMBULATORY_CARE_PROVIDER_SITE_OTHER): Payer: Medicaid Other | Admitting: Obstetrics and Gynecology

## 2016-10-08 ENCOUNTER — Encounter: Payer: Self-pay | Admitting: *Deleted

## 2016-10-08 VITALS — BP 103/56 | HR 85 | Wt 110.0 lb

## 2016-10-08 DIAGNOSIS — Z3483 Encounter for supervision of other normal pregnancy, third trimester: Secondary | ICD-10-CM | POA: Diagnosis not present

## 2016-10-08 DIAGNOSIS — O26843 Uterine size-date discrepancy, third trimester: Secondary | ICD-10-CM | POA: Diagnosis not present

## 2016-10-08 DIAGNOSIS — O09899 Supervision of other high risk pregnancies, unspecified trimester: Secondary | ICD-10-CM

## 2016-10-08 NOTE — Progress Notes (Signed)
   PRENATAL VISIT NOTE  Subjective:  Brianna Bishop is a 20 y.o. G2P1001 at 5910w0d being seen today for ongoing prenatal care.  She is currently monitored for the following issues for this low-risk pregnancy and has Supervision of normal pregnancy in third trimester; Short interval between pregnancies affecting pregnancy, antepartum; and Fundal height low for dates on her problem list.  Patient reports no complaints.  Contractions: Not present. Vag. Bleeding: None.  Movement: Present. Denies leaking of fluid.   The following portions of the patient's history were reviewed and updated as appropriate: allergies, current medications, past family history, past medical history, past social history, past surgical history and problem list. Problem list updated.  Objective:   Vitals:   10/08/16 1104  BP: (!) 103/56  Pulse: 85  Weight: 110 lb (49.9 kg)    Fetal Status: Fetal Heart Rate (bpm): 140   Movement: Present     General:  Alert, oriented and cooperative. Patient is in no acute distress.  Skin: Skin is warm and dry. No rash noted.   Cardiovascular: Normal heart rate noted  Respiratory: Normal respiratory effort, no problems with respiration noted  Abdomen: Soft, gravid, appropriate for gestational age. Pain/Pressure: Present     Pelvic:  Cervical exam deferred      Patient declined   Extremities: Normal range of motion.  Edema: None  Mental Status: Normal mood and affect. Normal behavior. Normal judgment and thought content.   Assessment and Plan:  Pregnancy: G2P1001 at 8010w0d  1. Encounter for supervision of other normal pregnancy in third trimester - IOL scheduled for 12/6 @ 7 am - NST And AFI on 12/4  2. Fundal height low for dates in third trimester - Recent US on 11/13 show no sign of IUGR   Term labor symptoms and general obstetric precautions including but not limited to vaginal bleeding, contractions, leaking of fluid and fetal movement were reviewed in detail with the  patient. Please refer to After Visit Summary for other counseling recommendations.  Return in about 5 days (around 10/13/2016) for nst/afi for postdates.   Duane LopeJennifer I Serenah Mill, NP

## 2016-10-08 NOTE — Progress Notes (Signed)
Used Video interpreter Gerilyn PilgrimJacob [phone]. Scheduled for IOL 10/15/16 7am.

## 2016-10-10 ENCOUNTER — Telehealth (HOSPITAL_COMMUNITY): Payer: Self-pay | Admitting: *Deleted

## 2016-10-10 NOTE — Telephone Encounter (Signed)
Preadmission screen  

## 2016-10-13 ENCOUNTER — Ambulatory Visit (INDEPENDENT_AMBULATORY_CARE_PROVIDER_SITE_OTHER): Payer: Medicaid Other | Admitting: Obstetrics and Gynecology

## 2016-10-13 ENCOUNTER — Ambulatory Visit: Payer: Self-pay

## 2016-10-13 VITALS — BP 102/69 | HR 96

## 2016-10-13 DIAGNOSIS — O480 Post-term pregnancy: Secondary | ICD-10-CM | POA: Diagnosis not present

## 2016-10-13 DIAGNOSIS — Z3483 Encounter for supervision of other normal pregnancy, third trimester: Secondary | ICD-10-CM

## 2016-10-13 DIAGNOSIS — Z3689 Encounter for other specified antenatal screening: Secondary | ICD-10-CM | POA: Diagnosis not present

## 2016-10-13 NOTE — Addendum Note (Signed)
Addended by: Jill SideAY, Farrin Shadle L on: 10/13/2016 04:48 PM   Modules accepted: Orders

## 2016-10-13 NOTE — Progress Notes (Signed)
NST Note Date: 10/13/2016 Gestational Age: 21/5 FHT: 130 baseline, positive accelerations, negative, decelerations, moderate variability Toco: quiet Time: 20 minutes  A/P: rNST. Continue with current plan of care.   Cornelia Copaharlie Rakwon Letourneau, Jr MD Attending Center for [facility] Mid[facility](Faculty Practice)

## 2016-10-15 ENCOUNTER — Encounter (HOSPITAL_COMMUNITY): Payer: Self-pay

## 2016-10-15 ENCOUNTER — Inpatient Hospital Stay (HOSPITAL_COMMUNITY)
Admission: RE | Admit: 2016-10-15 | Discharge: 2016-10-16 | DRG: 775 | Disposition: A | Discharge status: Home / Self Care | Payer: Medicaid Other | Source: Ambulatory Visit | Attending: Family Medicine | Admitting: Family Medicine

## 2016-10-15 ENCOUNTER — Inpatient Hospital Stay (HOSPITAL_COMMUNITY): Payer: Medicaid Other | Admitting: Anesthesiology

## 2016-10-15 DIAGNOSIS — O480 Post-term pregnancy: Secondary | ICD-10-CM | POA: Diagnosis present

## 2016-10-15 DIAGNOSIS — Z3483 Encounter for supervision of other normal pregnancy, third trimester: Secondary | ICD-10-CM

## 2016-10-15 DIAGNOSIS — Z3A41 41 weeks gestation of pregnancy: Secondary | ICD-10-CM | POA: Diagnosis not present

## 2016-10-15 DIAGNOSIS — Z833 Family history of diabetes mellitus: Secondary | ICD-10-CM

## 2016-10-15 DIAGNOSIS — O09899 Supervision of other high risk pregnancies, unspecified trimester: Secondary | ICD-10-CM

## 2016-10-15 LAB — CBC
HCT: 39.1 % (ref 36.0–46.0)
HEMOGLOBIN: 12.3 g/dL (ref 12.0–15.0)
MCH: 23.4 pg — AB (ref 26.0–34.0)
MCHC: 31.5 g/dL (ref 30.0–36.0)
MCV: 74.3 fL — ABNORMAL LOW (ref 78.0–100.0)
PLATELETS: 208 10*3/uL (ref 150–400)
RBC: 5.26 MIL/uL — AB (ref 3.87–5.11)
RDW: 22.5 % — ABNORMAL HIGH (ref 11.5–15.5)
WBC: 7 10*3/uL (ref 4.0–10.5)

## 2016-10-15 LAB — TYPE AND SCREEN
ABO/RH(D): A POS
ANTIBODY SCREEN: NEGATIVE

## 2016-10-15 MED ORDER — LACTATED RINGERS IV SOLN
500.0000 mL | Freq: Once | INTRAVENOUS | Status: AC
  Administered 2016-10-15: 500 mL via INTRAVENOUS

## 2016-10-15 MED ORDER — TETANUS-DIPHTH-ACELL PERTUSSIS 5-2.5-18.5 LF-MCG/0.5 IM SUSP: 0.5000 mL | Freq: Once | INTRAMUSCULAR | Status: DC

## 2016-10-15 MED ORDER — LACTATED RINGERS IV SOLN: 500.0000 mL | INTRAVENOUS | Status: DC | PRN

## 2016-10-15 MED ORDER — PHENYLEPHRINE 40 MCG/ML (10ML) SYRINGE FOR IV PUSH (FOR BLOOD PRESSURE SUPPORT)
80.0000 ug | PREFILLED_SYRINGE | INTRAVENOUS | Status: DC | PRN
  Administered 2016-10-15 (×2): 80 ug via INTRAVENOUS
  Filled 2016-10-15: qty 10
  Filled 2016-10-15: qty 5

## 2016-10-15 MED ORDER — OXYCODONE HCL 5 MG PO TABS: 5.0000 mg | ORAL_TABLET | ORAL | Status: DC | PRN

## 2016-10-15 MED ORDER — PRENATAL MULTIVITAMIN CH
1.0000 | ORAL_TABLET | Freq: Every day | ORAL | Status: DC
  Administered 2016-10-16: 1 via ORAL
  Filled 2016-10-15: qty 1

## 2016-10-15 MED ORDER — LACTATED RINGERS IV SOLN
INTRAVENOUS | Status: DC
  Administered 2016-10-15 (×2): via INTRAVENOUS

## 2016-10-15 MED ORDER — ONDANSETRON HCL 4 MG PO TABS: 4.0000 mg | ORAL_TABLET | ORAL | Status: DC | PRN

## 2016-10-15 MED ORDER — ACETAMINOPHEN 325 MG PO TABS: 650.0000 mg | ORAL_TABLET | ORAL | Status: DC | PRN

## 2016-10-15 MED ORDER — OXYTOCIN BOLUS FROM INFUSION: 500.0000 mL | Freq: Once | INTRAVENOUS | Status: DC

## 2016-10-15 MED ORDER — OXYCODONE HCL 5 MG PO TABS: 10.0000 mg | ORAL_TABLET | ORAL | Status: DC | PRN

## 2016-10-15 MED ORDER — BENZOCAINE-MENTHOL 20-0.5 % EX AERO: 1.0000 "application " | INHALATION_SPRAY | CUTANEOUS | Status: DC | PRN

## 2016-10-15 MED ORDER — LIDOCAINE HCL (PF) 1 % IJ SOLN
INTRAMUSCULAR | Status: DC | PRN
  Administered 2016-10-15: 4 mL via EPIDURAL
  Administered 2016-10-15: 3 mL via EPIDURAL

## 2016-10-15 MED ORDER — EPHEDRINE 5 MG/ML INJ
10.0000 mg | INTRAVENOUS | Status: DC | PRN
  Filled 2016-10-15: qty 4

## 2016-10-15 MED ORDER — ONDANSETRON HCL 4 MG/2ML IJ SOLN
4.0000 mg | Freq: Four times a day (QID) | INTRAMUSCULAR | Status: DC | PRN
Start: 2016-10-15 — End: 2016-10-15

## 2016-10-15 MED ORDER — MEASLES, MUMPS & RUBELLA VAC ~~LOC~~ INJ
0.5000 mL | INJECTION | Freq: Once | SUBCUTANEOUS | Status: DC
  Filled 2016-10-15: qty 0.5

## 2016-10-15 MED ORDER — METHYLERGONOVINE MALEATE 0.2 MG/ML IJ SOLN: 0.2000 mg | INTRAMUSCULAR | Status: DC | PRN

## 2016-10-15 MED ORDER — OXYTOCIN 40 UNITS IN LACTATED RINGERS INFUSION - SIMPLE MED
1.0000 m[IU]/min | INTRAVENOUS | Status: DC
  Administered 2016-10-15: 2 m[IU]/min via INTRAVENOUS

## 2016-10-15 MED ORDER — DIBUCAINE 1 % RE OINT: 1.0000 "application " | TOPICAL_OINTMENT | RECTAL | Status: DC | PRN

## 2016-10-15 MED ORDER — ZOLPIDEM TARTRATE 5 MG PO TABS: 5.0000 mg | ORAL_TABLET | Freq: Every evening | ORAL | Status: DC | PRN

## 2016-10-15 MED ORDER — TERBUTALINE SULFATE 1 MG/ML IJ SOLN
0.2500 mg | Freq: Once | INTRAMUSCULAR | Status: DC | PRN
  Filled 2016-10-15: qty 1

## 2016-10-15 MED ORDER — SOD CITRATE-CITRIC ACID 500-334 MG/5ML PO SOLN: 30.0000 mL | ORAL | Status: DC | PRN

## 2016-10-15 MED ORDER — SENNOSIDES-DOCUSATE SODIUM 8.6-50 MG PO TABS
2.0000 | ORAL_TABLET | ORAL | Status: DC
  Administered 2016-10-16: 2 via ORAL
  Filled 2016-10-15: qty 2

## 2016-10-15 MED ORDER — COCONUT OIL OIL: 1.0000 "application " | TOPICAL_OIL | Status: DC | PRN

## 2016-10-15 MED ORDER — DIPHENHYDRAMINE HCL 25 MG PO CAPS: 25.0000 mg | ORAL_CAPSULE | Freq: Four times a day (QID) | ORAL | Status: DC | PRN

## 2016-10-15 MED ORDER — ACETAMINOPHEN 325 MG PO TABS
650.0000 mg | ORAL_TABLET | ORAL | Status: DC | PRN
  Administered 2016-10-16: 650 mg via ORAL
  Filled 2016-10-15: qty 2

## 2016-10-15 MED ORDER — METHYLERGONOVINE MALEATE 0.2 MG PO TABS: 0.2000 mg | ORAL_TABLET | ORAL | Status: DC | PRN

## 2016-10-15 MED ORDER — PHENYLEPHRINE 40 MCG/ML (10ML) SYRINGE FOR IV PUSH (FOR BLOOD PRESSURE SUPPORT)
80.0000 ug | PREFILLED_SYRINGE | INTRAVENOUS | Status: DC | PRN
  Filled 2016-10-15: qty 10
  Filled 2016-10-15: qty 5

## 2016-10-15 MED ORDER — SIMETHICONE 80 MG PO CHEW: 80.0000 mg | CHEWABLE_TABLET | ORAL | Status: DC | PRN

## 2016-10-15 MED ORDER — FENTANYL 2.5 MCG/ML BUPIVACAINE 1/10 % EPIDURAL INFUSION (WH - ANES)
14.0000 mL/h | INTRAMUSCULAR | Status: DC | PRN
  Administered 2016-10-15: 14 mL/h via EPIDURAL
  Administered 2016-10-15: 11.5 mL/h via EPIDURAL
  Filled 2016-10-15: qty 100

## 2016-10-15 MED ORDER — ONDANSETRON HCL 4 MG/2ML IJ SOLN: 4.0000 mg | INTRAMUSCULAR | Status: DC | PRN

## 2016-10-15 MED ORDER — OXYTOCIN 40 UNITS IN LACTATED RINGERS INFUSION - SIMPLE MED
2.5000 [IU]/h | INTRAVENOUS | Status: DC
  Filled 2016-10-15: qty 1000

## 2016-10-15 MED ORDER — LIDOCAINE HCL (PF) 1 % IJ SOLN
30.0000 mL | INTRAMUSCULAR | Status: DC | PRN
  Filled 2016-10-15: qty 30

## 2016-10-15 MED ORDER — WITCH HAZEL-GLYCERIN EX PADS: 1.0000 "application " | MEDICATED_PAD | CUTANEOUS | Status: DC | PRN

## 2016-10-15 MED ORDER — IBUPROFEN 600 MG PO TABS
600.0000 mg | ORAL_TABLET | Freq: Four times a day (QID) | ORAL | Status: DC
  Administered 2016-10-16 (×3): 600 mg via ORAL
  Filled 2016-10-15 (×3): qty 1

## 2016-10-15 MED ORDER — DIPHENHYDRAMINE HCL 50 MG/ML IJ SOLN: 12.5000 mg | INTRAMUSCULAR | Status: DC | PRN

## 2016-10-15 NOTE — Anesthesia Pain Management Evaluation Note (Signed)
  CRNA Pain Management Visit Note  Patient: Brianna Bishop, 20 y.o., female  "Hello I am a [facility]. We have an anesthesia team available at all times to provide care throughout the hospital, including epidural management and anesthesia for C-section. I don't know your plan for the delivery whether it a natural birth, water birth, IV sedation, nitrous supplementation, doula or epidural, but we want to meet your pain goals."   1.Was your pain managed to your expectations on prior hospitalizations?   Did not ask  2.What is your expectation for pain management during this hospitalization?     Epidural  3.How can we help you reach that goal? Told RN patient wants epidural now.  Record the patient's initial score and the patient's pain goal.   Pain: 5  Pain Goal: 4 [facility] you to be able to say your pain was always managed very well.  Sumner Kirchman 10/15/2016

## 2016-10-15 NOTE — Anesthesia Procedure Notes (Signed)
Epidural Patient location during procedure: OB Start time: 10/15/2016 2:24 PM  Staffing Anesthesiologist: Mal AmabileFOSTER, Shandon Matson Performed: anesthesiologist   Preanesthetic Checklist Completed: patient identified, site marked, surgical consent, pre-op evaluation, timeout performed, IV checked, risks and benefits discussed and monitors and equipment checked  Epidural Patient position: sitting Prep: site prepped and draped and DuraPrep Patient monitoring: continuous pulse ox and blood pressure Approach: midline Location: L4-L5 Injection technique: LOR air  Needle:  Needle type: Tuohy  Needle gauge: 17 G Needle length: 9 cm and 9 Needle insertion depth: 4 cm Catheter type: closed end flexible Catheter size: 19 Gauge Catheter at skin depth: 9 cm Test dose: negative and Other  Assessment Events: blood not aspirated, injection not painful, no injection resistance, negative IV test and no paresthesia

## 2016-10-15 NOTE — Lactation Note (Signed)
This note was copied from a baby's chart. Lactation Consultation Note  Patient Name: Girl Heriberto AntiguaMariana Carriveau Today's Date: 10/15/2016 Reason for consult: Initial assessment   Initial consult with mom of < 1 hour old [facility]. Spoke with mother with assistance of Viria, Hospital interpreter. Mom reports her 2113 month old did not latch and that she pumped with a manual pump and did not get much milk.   Infant being examined by NP under warmer. Infant placed STS with mom. Infant irritable and would not latch for about 10 minutes. She then would latch intermittently with intermittent swallows noted. Infant was still at breast when I left the room.   Mom with small compressible breasts with everted nipples. Colostrum easily expressible from both breasts. Enc mom to hand express prior to feeding to stimulate milk flow. Mom plans to Breast and formula feed. Advised mom of supply and demand and enc mom to BF prior to offering formula.  Feeding log given with instructions for use.   BF Resources Handout and LC Brochure given, mom informed of IP/OP Services, BF Support Groups and LC phone #. [facility], she was given their number. She reports she does not have a pump at home.    Maternal Data Formula Feeding for Exclusion: Yes Reason for exclusion: Mother's choice to formula and breast feed on admission Has patient been taught Hand Expression?: Yes Does the patient have breastfeeding experience prior to this delivery?: No (7713 month old would not latch per mom, she pumped with manual pump and reports no milk)  Feeding Feeding Type: Breast Fed Length of feed: 10 min  LATCH Score/Interventions Latch: Repeated attempts needed to sustain latch, nipple held in mouth throughout feeding, stimulation needed to elicit sucking reflex. (Infant very irritable, crying on and off) Intervention(s): Adjust position;Assist with latch;Breast massage;Breast compression  Audible Swallowing: A  few with stimulation Intervention(s): Skin to skin;Hand expression;Alternate breast massage  Type of Nipple: Everted at rest and after stimulation  Comfort (Breast/Nipple): Soft / non-tender     Hold (Positioning): Assistance needed to correctly position infant at breast and maintain latch. Intervention(s): Breastfeeding basics reviewed;Support Pillows;Position options;Skin to skin  LATCH Score: 7  Lactation Tools Discussed/Used WIC Program: No   Consult Status Consult Status: Follow-up Date: 10/16/16 Follow-up type: In-patient    Silas FloodSharon S Sand[facility] 10/15/2016, 4:49 PM

## 2016-10-15 NOTE — Anesthesia Preprocedure Evaluation (Signed)
Anesthesia Evaluation  Patient identified by MRN, date of birth, ID band Patient awake    Reviewed: Allergy & Precautions, H&P , Patient's Chart, lab work & pertinent test results  Airway Mallampati: I  TM Distance: >3 FB Neck ROM: full    Dental no notable dental hx. (+) Teeth Intact   Pulmonary neg pulmonary ROS,    Pulmonary exam normal breath sounds clear to auscultation       Cardiovascular negative cardio ROS Normal cardiovascular exam Rhythm:Regular Rate:Normal     Neuro/Psych negative neurological ROS  negative psych ROS   GI/Hepatic negative GI ROS, Neg liver ROS,   Endo/Other  negative endocrine ROS  Renal/GU negative Renal ROS  negative genitourinary   Musculoskeletal negative musculoskeletal ROS (+)   Abdominal Normal abdominal exam  (+)   Peds  Hematology negative hematology ROS (+)   Anesthesia Other Findings   Reproductive/Obstetrics (+) Pregnancy                             Anesthesia Physical  Anesthesia Plan  ASA: II  Anesthesia Plan: Epidural   Post-op Pain Management:    Induction:   Airway Management Planned: Natural Airway  Additional Equipment:   Intra-op Plan:   Post-operative Plan:   Informed Consent: I have reviewed the patients History and Physical, chart, labs and discussed the procedure including the risks, benefits and alternatives for the proposed anesthesia with the patient or authorized representative who has indicated his/her understanding and acceptance.     Plan Discussed with: Anesthesiologist  Anesthesia Plan Comments:         Anesthesia Quick Evaluation

## 2016-10-15 NOTE — Progress Notes (Signed)
Interpreter present to discuss with patient plan of care for night shift: assessments for mom and baby, medication administration. Also reviewed breast feeding basics including hunger signs, proper latch, emptying breasts, s/sx of engorgement. Patient verbalizes understanding.

## 2016-10-15 NOTE — Progress Notes (Signed)
I was present during the delivery with Dr Genevie AnnSchenk by Orlan LeavensViria Alvarez Spanish Interpterer.

## 2016-10-15 NOTE — H&P (Signed)
LABOR AND DELIVERY ADMISSION HISTORY AND PHYSICAL NOTE  Brianna AntiguaMariana Manni is a 20 y.o. female G2P1001 with IUP at 5178w0d by 9wk US presenting for IOL for postdates.   She reports positive fetal movement. She denies leakage of fluid or vaginal bleeding. Interpretor was present in the room.  Past Medical History: Past Medical History:  Diagnosis Date  . Gastritis     Past Surgical History: Past Surgical History:  Procedure Laterality Date  . NO PAST SURGERIES      Obstetrical History: OB History    Gravida Para Term Preterm AB Living   2 1 1  0 0 1   SAB TAB Ectopic Multiple Live Births   0 0 0 0 1      Social History: Social History   Social History  . Marital status: Single    Spouse name: N/A  . Number of children: N/A  . Years of education: N/A   Social History Main Topics  . Smoking status: Never Smoker  . Smokeless tobacco: Never Used  . Alcohol use No  . Drug use: No  . Sexual activity: Not Currently    Birth control/ protection: None   Other Topics Concern  . None   Social History Narrative  . None    Family History: Family History  Problem Relation Age of Onset  . Cancer Maternal Grandmother   . Diabetes Paternal Grandmother     Allergies: No Known Allergies  Prescriptions Prior to Admission  Medication Sig Dispense Refill Last Dose  . ferrous sulfate (FERROUSUL) 325 (65 FE) MG tablet Take 1 tablet (325 mg total) by mouth 2 (two) times daily. 60 tablet 1 Taking  . Prenatal Multivit-Min-Fe-FA (PRENATAL VITAMINS) 0.8 MG tablet Take 1 tablet by mouth daily. 30 tablet 12 Taking     Review of Systems   All systems reviewed and negative except as stated in HPI   Physical Exam Height 5\' 1"  (1.549 m), weight 108 lb (49 kg), unknown if currently breastfeeding. General appearance: alert, cooperative, appears stated age and no distress Lungs: clear to auscultation bilaterally Heart: regular rate and rhythm Abdomen: soft, non-tender; bowel sounds  normal Extremities: No calf swelling or tenderness Presentation: cephalic  Fetal monitoring: 130, mod var, +accels, no decels Uterine activity: Occasional contractions   Dilation: 4 Effacement (%): 60 Station: -3 Presentation: Vertex Exam by:: Daiwik [place]   Prenatal labs: ABO, Rh: A/POS/-- (10/11 1359) Antibody: NEG (10/11 1359) Rubella: !Error! IMMUNE RPR: NON REAC (10/11 1359)  HBsAg: NEGATIVE (10/11 1359)  HIV: NONREACTIVE (10/11 1359)  GBS: Negative (11/02 0000)  1 hr Glucola: 104 Genetic screening: Too late Anatomy US:  Normal, but late gestation at time of anatomy scan, limited. Female  Prenatal Transfer Tool  Maternal Diabetes: No Genetic Screening: Normal Maternal Ultrasounds/Referrals: Normal Fetal Ultrasounds or other Referrals:  None Maternal Substance Abuse:  No Significant Maternal Medications:  None Significant Maternal Lab Results: Lab values include: Group B Strep negative  No results found for this or any previous visit (from the past 24 hour(s)).  Patient Active Problem List   Diagnosis Date Noted  . Post term pregnancy at [redacted] weeks gestation 10/15/2016  . Fundal height low for dates 09/25/2016  . Supervision of normal pregnancy in third trimester 08/20/2016  . Short interval between pregnancies affecting pregnancy, antepartum 08/20/2016    Assessment: Brianna Bishop is a 20 y.o. G2P1001 at 5578w0d here for IOL for postdates  #Labor: IOL with pitocin #Pain: IV pain meds prn, epidural on request #FWB:  Cat I  #ID:  GBS Neg #MOF: breastfeeding #MOC:Nexplanon #Circ:  N/A - girl  Jen MowElizabeth Jakobe Blau, DO OB Fellow Center for [facility], [facility] 10/15/2016, 8:49 AM

## 2016-10-16 LAB — RPR: RPR Ser Ql: NONREACTIVE

## 2016-10-16 NOTE — Lactation Note (Signed)
This note was copied from a baby's chart. Lactation Consultation Note Spanish Interpreter ID # [id number], this is mothers second child but she didn't breastfeed her first. Basic teaching done and reviewed Baby and Me Book. Mother receptive to all teaching. Infant latched well with good depth on the left breast. Observed suckles and swallowing. Mother advised to breastfeed 8-12 times in 24 hours. Mother is aware of available lactation services  And community support. Mother has lots of family support in the room.  Patient Name: Brianna Bishop [id number]'s Date: 10/16/2016 Reason for consult: Follow-up assessment   Maternal Data    Feeding Feeding Type: Breast Fed Length of feed: 20 min  LATCH Score/Interventions Latch: Grasps breast easily, tongue down, lips flanged, rhythmical sucking.                    Lactation Tools Discussed/Used     Consult Status      Michel BickersKendrick, Fabienne Nolasco McCoy 10/16/2016, 12:26 PM

## 2016-10-16 NOTE — Discharge Summary (Signed)
OB Discharge Summary     Patient Name: Brianna Bishop DOB: November 11, 1995 MRN: [id number]  Date of admission: 10/15/2016 Delivering MD: Gala MurdochMCPEEKS, KIMBERLY K   Date of discharge: 10/16/2016  Admitting diagnosis: INDUCTION Intrauterine pregnancy: 4648w0d     Secondary diagnosis:  Active Problems:   Post term pregnancy at [redacted] weeks gestation  Additional problems: none     Discharge diagnosis: Term Pregnancy Delivered                                                                                                Post partum procedures:none  Augmentation: Pitocin  Complications: None  Hospital course:  Onset of Labor With Vaginal Delivery     20 y.o. yo [id number] at 948w0d was admitted in Active Labor on 10/15/2016. Patient had an uncomplicated labor course as follows:  Membrane Rupture Time/Date: 3:14 PM ,10/15/2016   Intrapartum Procedures: Episiotomy: None [1]                                         Lacerations:  1st degree [2];Perineal [11]  Patient had a delivery of a Viable infant. 10/15/2016  Information for the patient's newborn:  Brianna Bishop, Girl Brianna Bishop [[id number]][[id number]]  Delivery Method: Vaginal, Spontaneous Delivery (Filed from Delivery Summary)    Pateint had an uncomplicated postpartum course.  She is ambulating, tolerating a regular diet, passing flatus, and urinating well. Patient is discharged home in stable condition on 10/16/16.    Physical exam  Vitals:   10/15/16 1830 10/15/16 2240 10/16/16 0356 10/16/16 0550  BP: (!) 102/54 (!) 100/46 (!) 91/53 (!) 96/50  Pulse: 67 74 80 62  Resp: 16 16 16 18   Temp: 98.3 F (36.8 C) 98.2 F (36.8 C) 98.1 F (36.7 C) 98.6 F (37 C)  TempSrc: Oral Oral Oral Oral  SpO2:  99% 100%   Weight:      Height:       General: alert, cooperative and no distress Lochia: appropriate Uterine Fundus: firm Incision: N/A DVT Evaluation: No evidence of DVT seen on physical exam. Labs: Lab Results  Component Value Date   WBC 7.0 10/15/2016   HGB 12.3 10/15/2016   HCT 39.1 10/15/2016   MCV 74.3 (L) 10/15/2016   PLT 208 10/15/2016   No flowsheet data found.  Discharge instruction: per After Visit Summary and "Baby and Me Booklet".  After visit meds:    Medication List    STOP taking these medications   ferrous sulfate 325 (65 FE) MG tablet Commonly known as:  FERROUSUL   Prenatal Vitamins 0.8 MG tablet       Diet: routine diet  Activity: Advance as tolerated. Pelvic rest for 6 weeks.   Outpatient follow up:6 weeks Follow up Appt:No future appointments. Follow up Visit:No Follow-up on file.  Postpartum contraception: Nexplanon  Newborn Data: Live born female  Birth Weight: 6 lb 6.8 oz (2914 g) APGAR: 8, 9  Baby Feeding: Bottle and Breast Disposition:home with mother  With the interpreter Bonnye FavaViria, reviewed breast  feeding and contraception plan, bleeding and when to call the clinic. Patient verbalized understanding.  10/16/2016 Marylene LandKathryn Lorraine Kamy Poinsett, CNM

## 2016-10-16 NOTE — Progress Notes (Signed)
[facility]  CSW completed chart review and notes no additional concerns.  CSW notes that MOB had a psychosocial assessment a year ago that noted no additional concerns at that time as well.  CSW spoke with pediatric staff to confirm no additional concerns.  CSW understands that MOB would like an early discharge.  Due to limited staffing and high acuity, CSW is not available to meet with MOB, but will monitor cord tissue drug screen results and make report to Child Protective Services if warranted.  Baby's UDS is negative.

## 2016-10-16 NOTE — Progress Notes (Addendum)
Post Partum Day 1 Subjective: no complaints. Has attempted both bottle and breast, but baby has only tolerated bottle thus far. Pediatricians have been collecting baby's urine, so pt would like to stay until she knows what their plan is.  Objective: Blood pressure (!) 96/50, pulse 62, temperature 98.6 F (37 C), temperature source Oral, resp. rate 18, height 5\' 1"  (1.549 m), weight 108 lb (49 kg), SpO2 100 %, unknown if currently breastfeeding.  Physical Exam:  General: alert and cooperative Lochia: appropriate Uterine Fundus: firm DVT Evaluation: No evidence of DVT seen on physical exam.   Recent Labs  10/15/16 0814  HGB 12.3  HCT 39.1    Assessment/Plan: Plan for discharge tomorrow   LOS: 1 day   Clearance Cootsndrew Tyson 10/16/2016, 7:46 AM   OB FELLOW POSTPARTUM PROGRESS NOTE ATTESTATION  I have seen and examined this patient and agree with above documentation in the resident's note.   Ernestina PennaNicholas Shailen Thielen, MD 8:54 AM

## 2016-10-16 NOTE — Anesthesia Postprocedure Evaluation (Signed)
Anesthesia Post Note  Patient: Brianna Bishop  Procedure(s) Performed: * No procedures listed *  Patient location during evaluation: Mother Baby Anesthesia Type: Epidural Level of consciousness: awake Pain management: satisfactory to patient Vital Signs Assessment: post-procedure vital signs reviewed and stable Respiratory status: spontaneous breathing Cardiovascular status: stable Anesthetic complications: no     Last Vitals:  Vitals:   10/16/16 0356 10/16/16 0550  BP: (!) 91/53 (!) 96/50  Pulse: 80 62  Resp: 16 18  Temp: 36.7 C 37 C    Last Pain:  Vitals:   10/16/16 0750  TempSrc:   PainSc: 5    Pain Goal:                 Cephus ShellingBURGER,[name]

## 2016-11-26 ENCOUNTER — Ambulatory Visit: Payer: Medicaid Other | Admitting: Family Medicine

## 2016-12-09 ENCOUNTER — Ambulatory Visit: Payer: Medicaid Other | Admitting: Obstetrics and Gynecology

## 2016-12-22 IMAGING — US US MFM UA CORD DOPPLER
1 series · 12 of 28 positions shown · non-contrast
Comparison: none

[Series 1: us mfm ua cord doppler · 30 acquisitions, 12 frames shown]
[im 2/30]
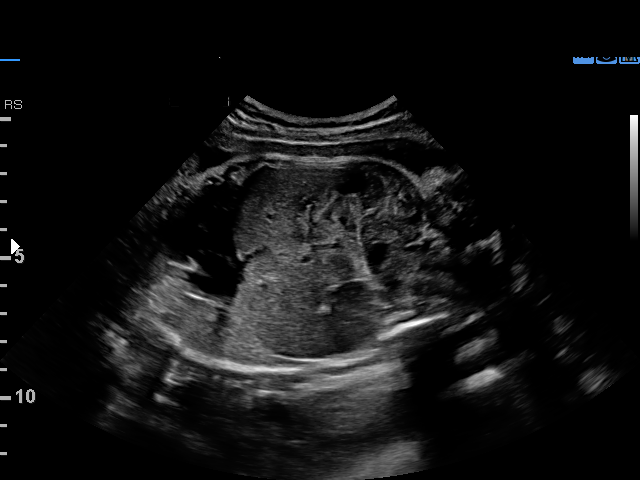
[im 4/30]
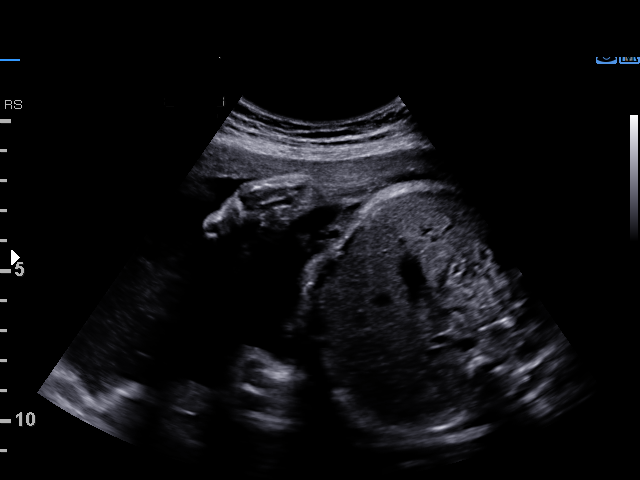
[im 6/30]
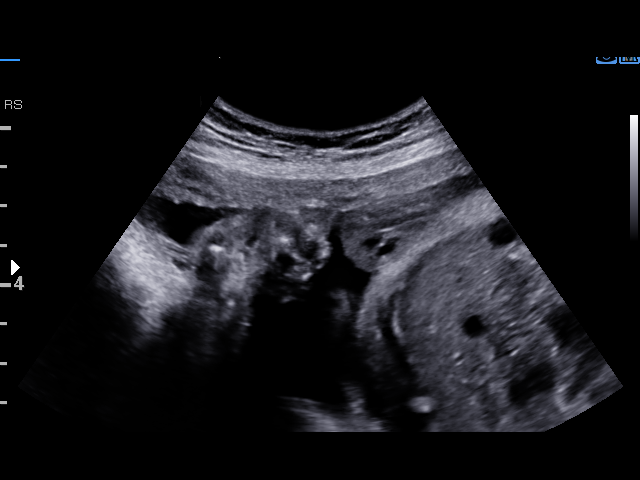
[im 9/30]
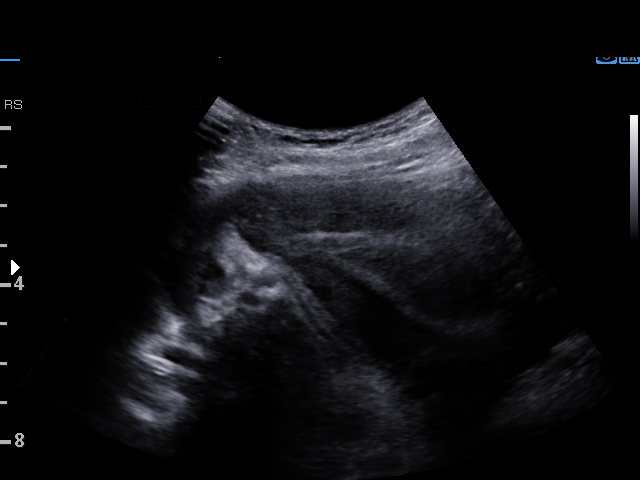
[im 11/30]
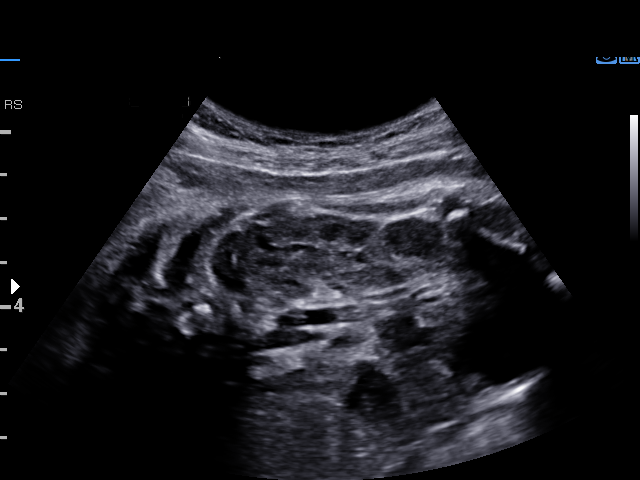
[im 13/30]
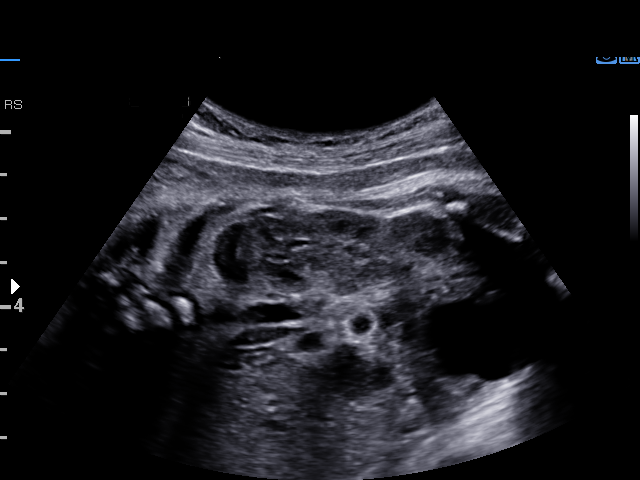
[im 17/30]
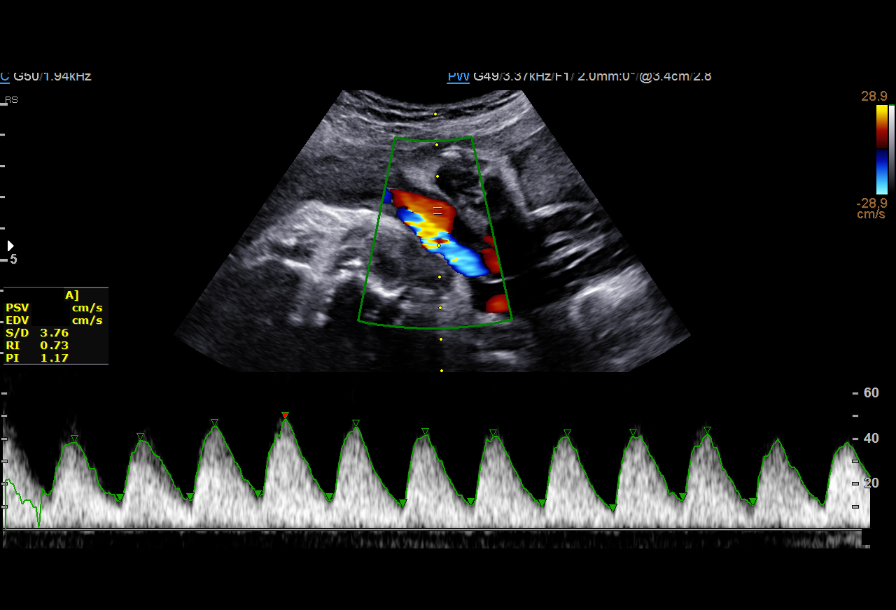
[im 19/30]
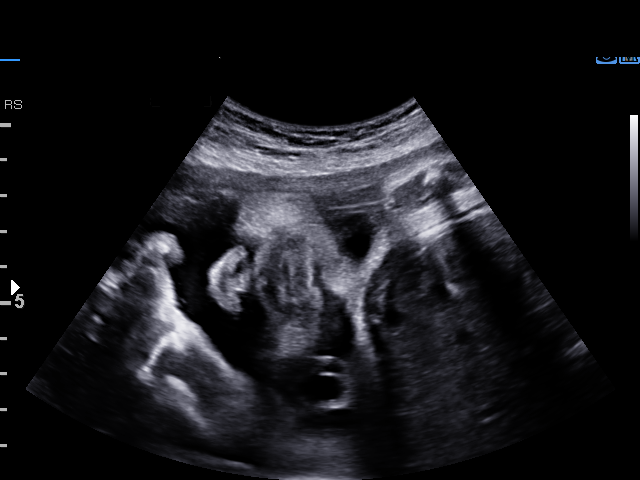
[im 21/30]
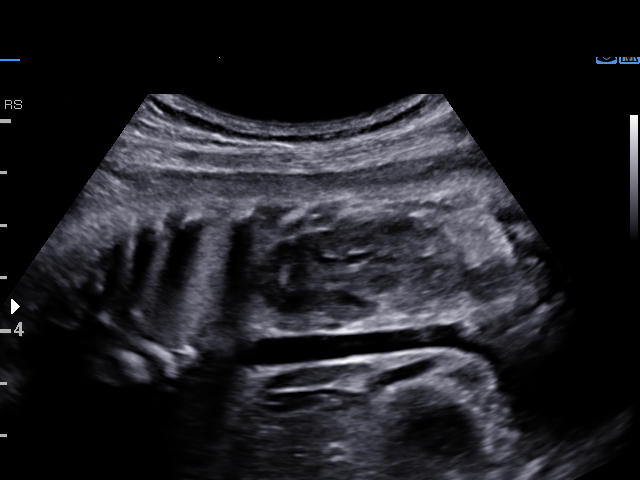
[im 24/30]
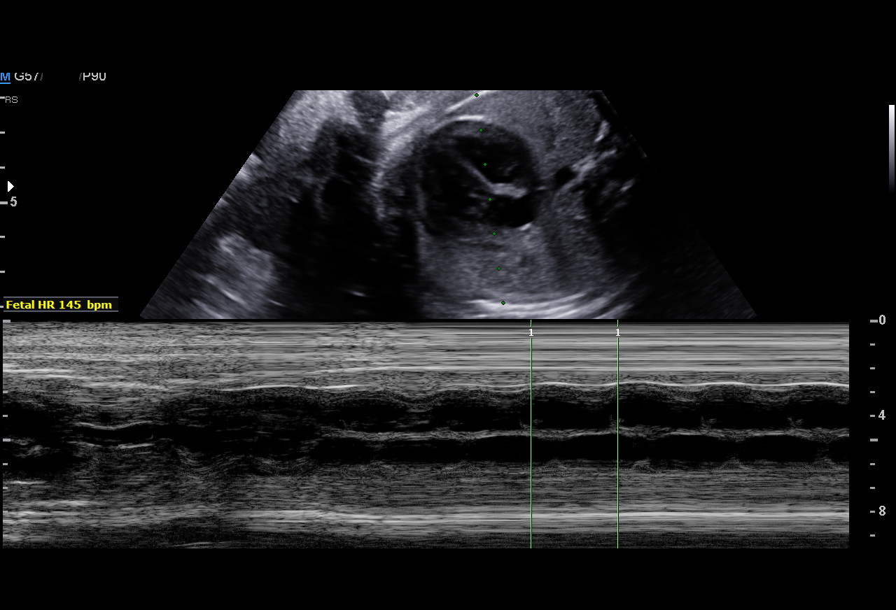
[im 26/30]
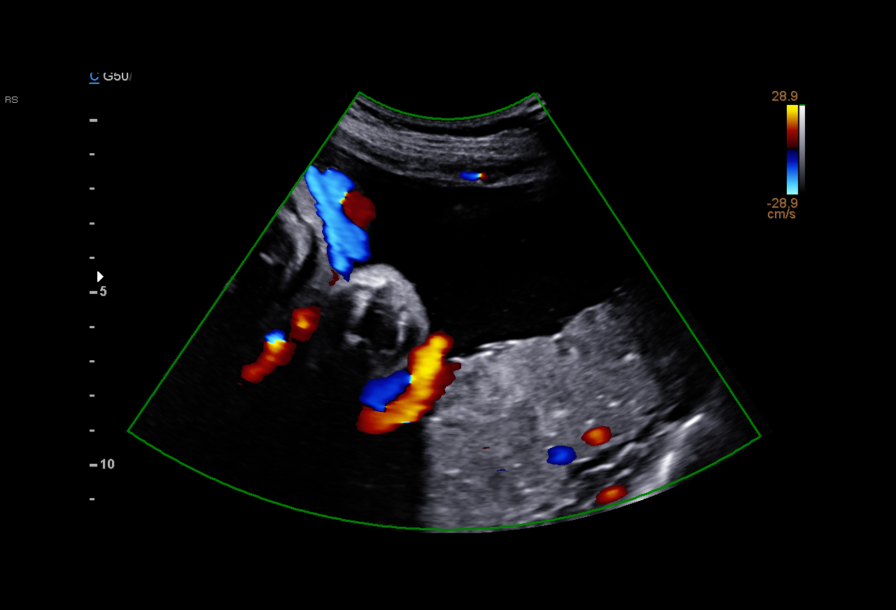
[im 28/30]
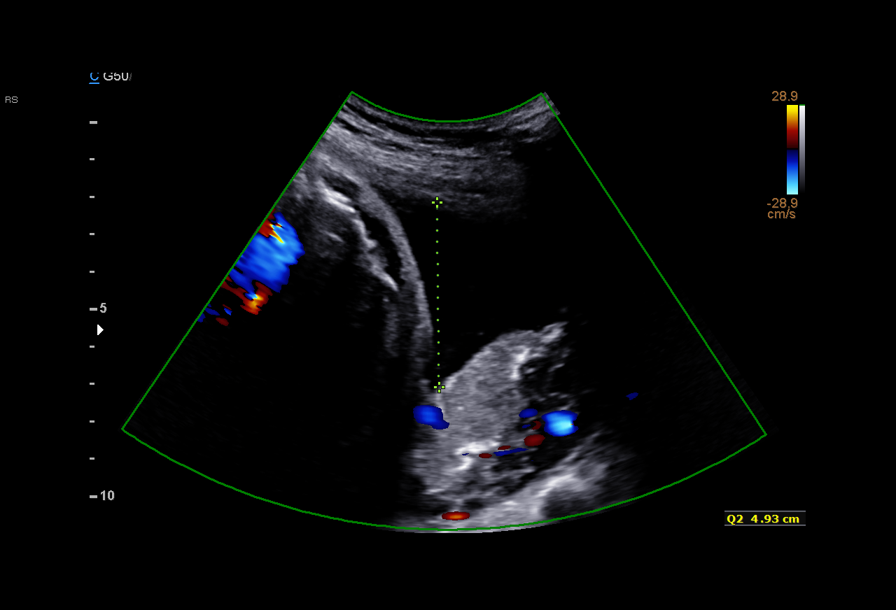

[12 of 28 positions shown; findings below may reference images not displayed]

1  JEPPE JUEL NICKELSEN            [id number]      [id number]     [id number]
2  JEPPE JUEL NICKELSEN            [id number]      [id number]     [id number]
Indications

34 weeks gestation of pregnancy
Short interval between pregancies, 3rd
trimester
Small for gestational age fetus affecting
management of mother
OB History

Gravidity:    2         Term:   1        Prem:   0        SAB:   0
TOP:          0       Ectopic:  0        Living: 1
Fetal Evaluation

Num Of Fetuses:     1
Fetal Heart         145
Rate(bpm):
Cardiac Activity:   Observed
Presentation:       Breech

Amniotic Fluid
AFI FV:      Subjectively within normal limits

AFI Sum(cm)     %Tile       Largest Pocket(cm)
21.32           80

RUQ(cm)       RLQ(cm)       LUQ(cm)        LLQ(cm)
5.02
Biophysical Evaluation
Amniotic F.V:   Pocket => 2 cm two         F. Tone:        Observed
planes
F. Movement:    Observed                   Score:          [DATE]
F. Breathing:   Observed
Gestational Age

Best:          34w 0d    Det. By:   Early Ultrasound         EDD:   10/08/16
(03/11/16)
Doppler - Fetal Vessels

Umbilical Artery
S/D     %tile     RI              PI              PSV    ADFV    RDFV
(cm/s)
3.01       72   0.67             1.02             56.58      No      No

Impression

SIUP at 34+0 weeks
Breech presentation
Normal amniotic fluid volume
BPP [DATE]
UA dopplers were normal for this GA

(Two days ago, EFW at the 24th %tile; AC at the 8th %tile)
Recommendations

Follow-up ultrasound for growth in 3 weeks
